# Patient Record
Sex: Male | Born: 1940 | Race: White | Hispanic: No | Marital: Married | Smoking: Never smoker
Health system: Southern US, Community
[De-identification: ages and names within clinical notes are randomized; demographics above are authoritative.]

## PROBLEM LIST (undated history)

## (undated) DIAGNOSIS — F32A Depression, unspecified: Secondary | ICD-10-CM

## (undated) DIAGNOSIS — I129 Hypertensive chronic kidney disease with stage 1 through stage 4 chronic kidney disease, or unspecified chronic kidney disease: Secondary | ICD-10-CM

## (undated) DIAGNOSIS — I4891 Unspecified atrial fibrillation: Secondary | ICD-10-CM

## (undated) DIAGNOSIS — N182 Chronic kidney disease, stage 2 (mild): Secondary | ICD-10-CM

## (undated) DIAGNOSIS — F419 Anxiety disorder, unspecified: Secondary | ICD-10-CM

## (undated) DIAGNOSIS — I2510 Atherosclerotic heart disease of native coronary artery without angina pectoris: Secondary | ICD-10-CM

## (undated) DIAGNOSIS — K219 Gastro-esophageal reflux disease without esophagitis: Secondary | ICD-10-CM

## (undated) DIAGNOSIS — E1122 Type 2 diabetes mellitus with diabetic chronic kidney disease: Secondary | ICD-10-CM

## (undated) DIAGNOSIS — I10 Essential (primary) hypertension: Secondary | ICD-10-CM

## (undated) DIAGNOSIS — F329 Major depressive disorder, single episode, unspecified: Secondary | ICD-10-CM

## (undated) DIAGNOSIS — M1990 Unspecified osteoarthritis, unspecified site: Secondary | ICD-10-CM

## (undated) DIAGNOSIS — I499 Cardiac arrhythmia, unspecified: Secondary | ICD-10-CM

## (undated) DIAGNOSIS — E785 Hyperlipidemia, unspecified: Secondary | ICD-10-CM

## (undated) HISTORY — PX: SHOULDER ARTHROSCOPY: SHX128

## (undated) HISTORY — DX: Chronic kidney disease, stage 2 (mild): N18.2

## (undated) HISTORY — DX: Hypertensive chronic kidney disease with stage 1 through stage 4 chronic kidney disease, or unspecified chronic kidney disease: I12.9

## (undated) HISTORY — PX: BACK SURGERY: SHX140

## (undated) HISTORY — PX: CERVICAL DISC SURGERY: SHX588

## (undated) HISTORY — PX: TONSILLECTOMY: SUR1361

## (undated) HISTORY — DX: Type 2 diabetes mellitus with diabetic chronic kidney disease: E11.22

## (undated) HISTORY — DX: Essential (primary) hypertension: I10

## (undated) HISTORY — DX: Hyperlipidemia, unspecified: E78.5

## (undated) HISTORY — DX: Unspecified atrial fibrillation: I48.91

## (undated) HISTORY — DX: Atherosclerotic heart disease of native coronary artery without angina pectoris: I25.10

## (undated) HISTORY — PX: INGUINAL HERNIA REPAIR: SUR1180

## (undated) HISTORY — DX: Gastro-esophageal reflux disease without esophagitis: K21.9

## (undated) HISTORY — PX: APPENDECTOMY: SHX54

---

## 1999-04-26 HISTORY — PX: CORONARY ARTERY BYPASS GRAFT: SHX141

## 2014-04-25 HISTORY — PX: COLONOSCOPY: SHX174

## 2015-01-24 HISTORY — PX: SIGMOIDOSCOPY: SUR1295

## 2015-01-24 HISTORY — PX: ESOPHAGOGASTRODUODENOSCOPY: SHX1529

## 2015-11-02 ENCOUNTER — Telehealth: Payer: Self-pay | Admitting: Gastroenterology

## 2015-11-02 NOTE — Telephone Encounter (Signed)
Pt called to see if SF had received a referral. I told patient that DS had mailed a triage letter out today, but she was already gone for the day. Please call patient at [phone]

## 2015-11-03 NOTE — Telephone Encounter (Signed)
Pt wanted an OV so I made him one for 11/26/15 @ 9:30

## 2015-11-03 NOTE — Telephone Encounter (Signed)
LMOM TO CALL BACK

## 2015-11-10 ENCOUNTER — Encounter: Payer: Self-pay | Admitting: Nurse Practitioner

## 2015-11-26 ENCOUNTER — Ambulatory Visit: Payer: Self-pay | Admitting: Nurse Practitioner

## 2015-12-14 ENCOUNTER — Ambulatory Visit: Payer: Self-pay | Admitting: Nurse Practitioner

## 2015-12-16 ENCOUNTER — Ambulatory Visit (INDEPENDENT_AMBULATORY_CARE_PROVIDER_SITE_OTHER): Payer: Medicare Other | Admitting: Gastroenterology

## 2015-12-16 ENCOUNTER — Encounter: Payer: Self-pay | Admitting: Gastroenterology

## 2015-12-16 DIAGNOSIS — K649 Unspecified hemorrhoids: Secondary | ICD-10-CM | POA: Diagnosis not present

## 2015-12-16 DIAGNOSIS — K5900 Constipation, unspecified: Secondary | ICD-10-CM | POA: Diagnosis not present

## 2015-12-16 MED ORDER — HYDROCORTISONE 2.5 % RE CREA: 1.0000 "application " | TOPICAL_CREAM | Freq: Two times a day (BID) | RECTAL | 1 refills | Status: DC

## 2015-12-16 MED ORDER — LUBIPROSTONE 24 MCG PO CAPS
24.0000 ug | ORAL_CAPSULE | Freq: Two times a day (BID) | ORAL | 5 refills | Status: DC
Start: 2015-12-16 — End: 2015-12-16

## 2015-12-16 MED ORDER — LUBIPROSTONE 24 MCG PO CAPS: 24.0000 ug | ORAL_CAPSULE | Freq: Two times a day (BID) | ORAL | 5 refills | Status: DC

## 2015-12-16 NOTE — Patient Instructions (Signed)
1. Start Amitiza twice daily with food for constipation. You can stop stool softener once you start having regular bowel movements on the Amitiza.  2. Start hydrocortisone cream twice daily for hemorrhoids, insert medication just inside the anal opening.  3. Return to the office in 4-6 weeks for follow up and possible hemorrhoid banding.

## 2015-12-16 NOTE — Progress Notes (Signed)
CC'ED TO PCP 

## 2015-12-16 NOTE — Progress Notes (Signed)
Primary Care Physician:  Isabella BowensVASIREDDY,VENUGOPAL KIRAN, MD  Primary Gastroenterologist:  Roetta SessionsMichael Rourk, MD   Chief Complaint  Patient presents with  . Constipation    HPI:  Johnathan KannerWilliam R Chavez is a 75 y.o. male who presents today as a new patient evaluation for constipation and hemorrhoids. Referral initially sent over by his PCP back in July 2017 for colonoscopy. Patient rescheduled couple visits before is able to come in today. He is actually up-to-date on colonoscopy. He brought me his records. He is interested in having his constipation and hemorrhoids managed. Records [place] [facility]. He had nonbleeding internal hemorrhoids, diverticulosis of the sigmoid colon, 4 mm polyp removed from the hepatic and the descending colon. Terminal ileum was normal. Pathology revealed tubular adenoma. He was advised to come back in 5 years.  Last year patient began having significant constipation issues. Associated with hemorrhoidal bleeding. Was seen at that day and will gastroenterology underwent EGD which showed gastritis, sigmoidoscopy showing internal hemorrhoids. Patient was dissatisfied with his care and decided to come here for further evaluation.  Patient is utilizing a stool softener 2 at bedtime. Takes Ex-Lax every third day. Hasn't had a bowel movement. Never has a easy BM. Has to strain quite a bit. Bright red blood per rectum noted 90% of the time. Does not use pain medication on a regular basis. Tramadol only as needed. Heartburn well-controlled on omeprazole twice daily. He notes his hemorrhoids do prolapse out but then go back in on their own. Patient has never used any hemorrhoid medication. Denies rectal pain. No abdominal pain.   Current Outpatient Prescriptions  Medication Sig Dispense Refill  . amLODipine (NORVASC) 10 MG tablet Take 10 mg by mouth daily.  0  . aspirin (GOODSENSE ASPIRIN) 325 MG  tablet Take by mouth.    . calcium-vitamin D (SM CALCIUM 500/VITAMIN D3) 500-400 MG-UNIT tablet Take by mouth.    . docusate sodium (COLACE) 100 MG capsule 2 (TWO) CAPSULE, ORAL, AT BEDTIME    . gabapentin (NEURONTIN) 300 MG capsule Take by mouth.    . Ginkgo Biloba 40 MG TABS Take by mouth.    . Glucosamine-Chondroitin 500-400 MG CAPS Take by mouth.    Marland Kitchen. lisinopril (PRINIVIL,ZESTRIL) 20 MG tablet Take by mouth.    . metFORMIN (GLUMETZA) 500 MG (MOD) 24 hr tablet Take by mouth.    . Omega-3 Fatty Acids (FISH OIL CONCENTRATE) 300 MG CAPS Take by mouth.    Marland Kitchen. omeprazole (PRILOSEC) 20 MG capsule Take 20 mg by mouth 2 (two) times daily before a meal.    . Rivaroxaban (XARELTO) 15 MG TABS tablet Take by mouth.    . simvastatin (ZOCOR) 40 MG tablet Take by mouth.    . traMADol (ULTRAM) 50 MG tablet Take 50 mg by mouth 2 (two) times daily as needed.  0  . vitamin C (ASCORBIC ACID) 500 MG tablet Take by mouth.                   No current facility-administered medications for this visit.     Allergies as of 12/16/2015 - Review Complete 12/16/2015  Allergen Reaction Noted  . Codeine Nausea Only 02/19/2015    Past Medical History:  Diagnosis Date  . A-fib (HCC)   . CAD (coronary artery disease)   . CKD (chronic kidney disease) stage 2, GFR 60-89 ml/min   . GERD (gastroesophageal reflux disease)   . HTN (hypertension)   .  Hyperlipidemia   . 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 APPENDECTOMY    . BACK SURGERY     X2  . CERVICAL DISC SURGERY    . COLONOSCOPY  04/2014   [facility]: moderate sized internal hemorrhoids, 2 tubular adenomas removed from colon. diverticulosis  . CORONARY ARTERY BYPASS GRAFT  2001  . ESOPHAGOGASTRODUODENOSCOPY  01/2015   Dr. Allena KatzPatel: gastritis  . INGUINAL HERNIA REPAIR Left   . SHOULDER ARTHROSCOPY Right   . SIGMOIDOSCOPY  01/2015   Dr. Allena KatzPatel: internal hemorrhoids  .  TONSILLECTOMY      Family History  Problem Relation Age of Onset  . Diabetes Mother   . Colon cancer Father     ?colon cancer, age early 6970    Social History   Social History  . Marital status: Married    Spouse name: N/A  . Number of children: N/A  . Years of education: N/A   Occupational History  . Not on file.   Social History Main Topics  . Smoking status: Never Smoker  . Smokeless tobacco: Never Used  . Alcohol use No  . Drug use: No  . Sexual activity: Not on file   Other Topics Concern  . Not on file   Social History Narrative  . No narrative on file      ROS:  General: Negative for anorexia, weight loss, fever, chills, fatigue, weakness. Eyes: Negative for vision changes.  ENT: Negative for hoarseness, difficulty swallowing , nasal congestion. CV: Negative for chest pain, angina, palpitations, dyspnea on exertion, peripheral edema.  Respiratory: Negative for dyspnea at rest, dyspnea on exertion, cough, sputum, wheezing.  GI: See history of present illness. GU:  Negative for dysuria, hematuria, urinary incontinence, urinary frequency, nocturnal urination.  MS: Negative for joint pain, low back pain.  Derm: Negative for rash or itching.  Neuro: Negative for weakness, abnormal sensation, seizure, frequent headaches, memory loss, confusion.  Psych: Negative for anxiety, depression, suicidal ideation, hallucinations.  Endo: Negative for unusual weight change.  Heme: Negative for bruising or bleeding. Allergy: Negative for rash or hives.    Physical Examination:  BP 137/77   Pulse 73   Temp 98.1 F (36.7 C) (Oral)   Ht 5\' 5"  (1.651 m)   Wt 176 lb 6.4 oz (80 kg)   BMI 29.35 kg/m    General: Well-nourished, well-developed in no acute distress.  Head: Normocephalic, atraumatic.   Eyes: Conjunctiva pink, no icterus. Mouth: Oropharyngeal mucosa moist and pink , no lesions erythema or exudate. Neck: Supple without thyromegaly, masses, or  lymphadenopathy.  Lungs: Clear to auscultation bilaterally.  Heart: Regular rate and rhythm, no murmurs rubs or gallops.  Abdomen: Bowel sounds are normal, nontender, nondistended, no hepatosplenomegaly or masses, no abdominal bruits or    hernia , no rebound or guarding.   Rectal: deferred Extremities: No lower extremity edema. No clubbing or deformities.  Neuro: Alert and oriented x 4 , grossly normal neurologically.  Skin: Warm and dry, no rash or jaundice.   Psych: Alert and cooperative, normal mood and affect.  Labs: Labs from April 2017 Hemoglobin A1c 7.8 BUN 25, creatinine 1.3, white blood cell count 8900, hemoglobin 14.7, hematocrit 46.4, MCV 93.2, platelets 237,000  Imaging Studies: No results found.

## 2015-12-16 NOTE — Assessment & Plan Note (Signed)
75 year old gentleman who presents with nearly one year history of change in bowel habits with constipation. Associated straining, rectal bleeding 90% of the time. Taking Colace 200 mg daily, Ex-Lax 2 times weekly. Last complete colonoscopy January 2016 as outlined above. Sigmoidoscopy October 2016 with internal hemorrhoids is noted. Patient describes reducible prolapsed hemorrhoids at times.  Discussed constipation management with patient. Need to get that under control and avoid straining, prolonged toilet time. Begin Amitiza 24 g twice a day. Come off Colace once regular bowel movement. Hydrocortisone cream anal rectally twice a day for 2 weeks. Patient will call if current bowel regimen not effective or too effective. Otherwise we will see him back in 4-6 weeks on [facility] as he sounds like a good candidate based on prior colonoscopy/sigmoidoscopy reports. Discussed [facility] banding with patient and he would like to pursue if he is a candidate.

## 2015-12-18 ENCOUNTER — Telehealth: Payer: Self-pay

## 2015-12-18 NOTE — Telephone Encounter (Signed)
Pt left Vm that he has questions about the Amitiza. ' I called and LMOM for a return call.

## 2015-12-24 NOTE — Telephone Encounter (Signed)
Pt's wife called and said pt is unable to take the Amitiza. He tried bid , then qd and then tried 1/2 tablet and the medication makes him very weak and gives him nausea. He was taking it with food. If he does not take it, he cannot have a BM. He still has a lot of bleeding when he has BM, but is using the hemorrhoid cream.  Please advise and call Diane back at [phone].   Routing to Tana CoastLeslie Lewis, PA to advise and to JL, this is RMR pt.

## 2015-12-24 NOTE — Telephone Encounter (Signed)
Stop Amitiza. Try Linzess daily on empty stomach. Can provide samples or send in RX for #30 with 1 refill. Continue cream. He has hemorrhoids. We are trying to get BMs under control prior to hemorrhoids banding which is scheduled.

## 2015-12-24 NOTE — Telephone Encounter (Signed)
Pt's wife is aware. I called the prescription for the Linzess to the pharmacy and left the message on VM.  Pt's wife also wanted me to leave a few samples at front for pt to pick up if they were as expensive as the Amitiza.  I am leaving #12 samples at front for pick up. Pt's wife is aware to STOP Amitiza. I also left Vm for the pharmacy to D/C Amitiza.

## 2016-01-04 ENCOUNTER — Ambulatory Visit: Payer: Self-pay | Admitting: Nurse Practitioner

## 2016-01-19 ENCOUNTER — Ambulatory Visit: Payer: Medicare Other | Admitting: Internal Medicine

## 2016-01-19 ENCOUNTER — Encounter: Payer: Self-pay | Admitting: Internal Medicine

## 2016-01-19 VITALS — BP 121/60 | HR 58 | Temp 97.4°F | Ht 65.0 in | Wt 179.6 lb

## 2016-01-19 DIAGNOSIS — K921 Melena: Secondary | ICD-10-CM

## 2016-01-19 DIAGNOSIS — K5909 Other constipation: Secondary | ICD-10-CM

## 2016-01-19 DIAGNOSIS — K219 Gastro-esophageal reflux disease without esophagitis: Secondary | ICD-10-CM

## 2016-01-19 NOTE — Patient Instructions (Signed)
Continue linzess 72 daily as needed - our goal is a BM every other day or at least 3x weekly  Continue omeprazole daily for reflux  Office visit with us in 1 year and as needed

## 2016-01-19 NOTE — Progress Notes (Signed)
Primary Care Physician:  Isabella Bowens, MD Primary Gastroenterologist:  Dr. Jena Gauss  Pre-Procedure History & Physical: HPI:  Johnathan Chavez is a 75 y.o. male here for follow-up of rectal bleeding and constipation. Seen here last month. Originally prescribed Amitiza. Linzess turned out to be a better choice from a reimbursement standpoint. His states taking Linzess 72 every other day when necessary. Has a bowel movement at least 3 times weekly - he is very pleased. Rectal bleeding has ceased  - colonoscopy report reviewed.  He is happy. GERD symptoms well controlled on omeprazole.  There were some discussion about hemorrhoid banding, however, he is anticoagulated for atrial fibrillation.  Past Medical History:  Diagnosis Date  . A-fib (HCC)   . CAD (coronary artery disease)   . CKD (chronic kidney disease) stage 2, GFR 60-89 ml/min   . GERD (gastroesophageal reflux disease)   . HTN (hypertension)   . Hyperlipidemia   . 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 APPENDECTOMY    . BACK SURGERY     X2  . CERVICAL DISC SURGERY    . COLONOSCOPY  04/2014   [facility]: moderate sized internal hemorrhoids, 2 tubular adenomas removed from colon. diverticulosis  . CORONARY ARTERY BYPASS GRAFT  2001  . ESOPHAGOGASTRODUODENOSCOPY  01/2015   Dr. Allena Katz: gastritis  . INGUINAL HERNIA REPAIR Left   . SHOULDER ARTHROSCOPY Right   . SIGMOIDOSCOPY  01/2015   Dr. Allena Katz: internal hemorrhoids  . TONSILLECTOMY      Prior to Admission medications   Medication Sig Start Date End Date Taking? Authorizing Provider  amLODipine (NORVASC) 10 MG tablet Take 10 mg by mouth daily. 12/08/15  Yes Historical Provider, MD  aspirin (GOODSENSE ASPIRIN) 325 MG tablet Take by mouth.   Yes Historical Provider, MD  calcium-vitamin D (SM CALCIUM 500/VITAMIN D3) 500-400 MG-UNIT tablet Take by mouth.   Yes Historical  Provider, MD  docusate sodium (COLACE) 100 MG capsule 2 (TWO) CAPSULE, ORAL, AT BEDTIME 01/19/15  Yes Historical Provider, MD  gabapentin (NEURONTIN) 300 MG capsule Take by mouth.   Yes Historical Provider, MD  Ginkgo Biloba 40 MG TABS Take by mouth.   Yes Historical Provider, MD  Glucosamine-Chondroitin 500-400 MG CAPS Take by mouth.   Yes Historical Provider, MD  hydrocortisone (ANUSOL-HC) 2.5 % rectal cream Place 1 application rectally 2 (two) times daily. 12/16/15  Yes Tiffany Kocher, PA-C  lisinopril (PRINIVIL,ZESTRIL) 20 MG tablet Take by mouth.   Yes Historical Provider, MD  metFORMIN (GLUMETZA) 500 MG (MOD) 24 hr tablet Take by mouth.   Yes Historical Provider, MD  Omega-3 Fatty Acids (FISH OIL CONCENTRATE) 300 MG CAPS Take by mouth.   Yes Historical Provider, MD  omeprazole (PRILOSEC) 20 MG capsule Take 20 mg by mouth 2 (two) times daily before a meal.   Yes Historical Provider, MD  simvastatin (ZOCOR) 40 MG tablet Take by mouth.   Yes Historical Provider, MD  traMADol (ULTRAM) 50 MG tablet Take 50 mg by mouth 2 (two) times daily as needed. 10/13/15  Yes Historical Provider, MD  vitamin C (ASCORBIC ACID) 500 MG tablet Take by mouth.   Yes Historical Provider, MD  warfarin (COUMADIN) 5 MG tablet Take 5 mg by mouth daily. 5 mg Tues and Wednesday and 2.5 mg the other days   Yes Historical Provider, MD  Rivaroxaban (XARELTO) 15 MG TABS tablet Take by mouth.  09/14/15   Historical Provider, MD    Allergies as of 01/19/2016 - Review Complete 01/19/2016  Allergen Reaction Noted  . Codeine Nausea Only 02/19/2015    Family History  Problem Relation Age of Onset  . Diabetes Mother   . Colon cancer Father     ?colon cancer, age early 9070    Social History   Social History  . Marital status: Married    Spouse name: N/A  . Number of children: N/A  . Years of education: N/A   Occupational History  . Not on file.   Social History Main Topics  . Smoking status: Never Smoker  . Smokeless  tobacco: Never Used  . Alcohol use No  . Drug use: No  . Sexual activity: Not on file   Other Topics Concern  . Not on file   Social History Narrative  . No narrative on file    Review of Systems: See HPI, otherwise negative ROS  Physical Exam: BP 121/60   Pulse (!) 58   Temp 97.4 F (36.3 C) (Oral)   Ht 5\' 5"  (1.651 m)   Wt 179 lb 9.6 oz (81.5 kg)   BMI 29.89 kg/m  General:   Alert,  Well-developed, well-nourished, pleasant and cooperative in NAD Neck:  Supple; no masses or thyromegaly. No significant cervical adenopathy. Lungs:  Clear throughout to auscultation.   No wheezes, crackles, or rhonchi. No acute distress. Heart:  Regular rate and rhythm; no murmurs, clicks, rubs,  or gallops. Abdomen: Non-distended, normal bowel sounds.  Soft and nontender without appreciable mass or hepatosplenomegaly.  Pulses:  Normal pulses noted. Extremities:  Without clubbing or edema.  Impression:  Pleasant 75 year old gentleman with intermittent paper hematochezia in the setting of anticoagulation constipation. Now doing very well takingLinzess 72 when necessary. He is having at least 3 bowel movements weekly which is within range.  Recommendations:   Continue linzess 72 daily as needed - our goal is a BM every other day or at least 3x weekly  Continue omeprazole daily for reflux  Office visit with us in 1 year and as needed         Notice: This dictation was prepared with Dragon dictation along with smaller phrase technology. Any transcriptional errors that result from this process are unintentional and may not be corrected upon review.

## 2016-03-04 ENCOUNTER — Encounter (HOSPITAL_COMMUNITY): Payer: Self-pay | Admitting: Emergency Medicine

## 2016-03-04 ENCOUNTER — Inpatient Hospital Stay (HOSPITAL_COMMUNITY)
Admission: EM | Admit: 2016-03-04 | Discharge: 2016-03-07 | DRG: 439 | Disposition: A | Discharge status: Home / Self Care | Payer: Medicare Other | Attending: Family Medicine | Admitting: Family Medicine

## 2016-03-04 ENCOUNTER — Emergency Department (HOSPITAL_COMMUNITY): Payer: Medicare Other

## 2016-03-04 DIAGNOSIS — N183 Chronic kidney disease, stage 3 unspecified: Secondary | ICD-10-CM | POA: Diagnosis present

## 2016-03-04 DIAGNOSIS — J9811 Atelectasis: Secondary | ICD-10-CM | POA: Diagnosis present

## 2016-03-04 DIAGNOSIS — E1122 Type 2 diabetes mellitus with diabetic chronic kidney disease: Secondary | ICD-10-CM | POA: Diagnosis present

## 2016-03-04 DIAGNOSIS — E119 Type 2 diabetes mellitus without complications: Secondary | ICD-10-CM

## 2016-03-04 DIAGNOSIS — J90 Pleural effusion, not elsewhere classified: Secondary | ICD-10-CM | POA: Diagnosis present

## 2016-03-04 DIAGNOSIS — K859 Acute pancreatitis without necrosis or infection, unspecified: Secondary | ICD-10-CM

## 2016-03-04 DIAGNOSIS — N179 Acute kidney failure, unspecified: Secondary | ICD-10-CM | POA: Diagnosis present

## 2016-03-04 DIAGNOSIS — Z833 Family history of diabetes mellitus: Secondary | ICD-10-CM

## 2016-03-04 DIAGNOSIS — I129 Hypertensive chronic kidney disease with stage 1 through stage 4 chronic kidney disease, or unspecified chronic kidney disease: Secondary | ICD-10-CM | POA: Diagnosis present

## 2016-03-04 DIAGNOSIS — K851 Biliary acute pancreatitis without necrosis or infection: Secondary | ICD-10-CM | POA: Diagnosis present

## 2016-03-04 DIAGNOSIS — D649 Anemia, unspecified: Secondary | ICD-10-CM | POA: Diagnosis present

## 2016-03-04 DIAGNOSIS — K8500 Idiopathic acute pancreatitis without necrosis or infection: Principal | ICD-10-CM

## 2016-03-04 DIAGNOSIS — K2980 Duodenitis without bleeding: Secondary | ICD-10-CM | POA: Diagnosis present

## 2016-03-04 DIAGNOSIS — R748 Abnormal levels of other serum enzymes: Secondary | ICD-10-CM

## 2016-03-04 DIAGNOSIS — R7989 Other specified abnormal findings of blood chemistry: Secondary | ICD-10-CM | POA: Diagnosis present

## 2016-03-04 DIAGNOSIS — K861 Other chronic pancreatitis: Secondary | ICD-10-CM | POA: Diagnosis present

## 2016-03-04 DIAGNOSIS — I4891 Unspecified atrial fibrillation: Secondary | ICD-10-CM | POA: Diagnosis present

## 2016-03-04 DIAGNOSIS — N289 Disorder of kidney and ureter, unspecified: Secondary | ICD-10-CM

## 2016-03-04 DIAGNOSIS — K8020 Calculus of gallbladder without cholecystitis without obstruction: Secondary | ICD-10-CM | POA: Diagnosis present

## 2016-03-04 DIAGNOSIS — Z7901 Long term (current) use of anticoagulants: Secondary | ICD-10-CM

## 2016-03-04 DIAGNOSIS — K219 Gastro-esophageal reflux disease without esophagitis: Secondary | ICD-10-CM | POA: Diagnosis present

## 2016-03-04 DIAGNOSIS — I2510 Atherosclerotic heart disease of native coronary artery without angina pectoris: Secondary | ICD-10-CM | POA: Diagnosis present

## 2016-03-04 DIAGNOSIS — Z800 Family history of malignant neoplasm of digestive organs: Secondary | ICD-10-CM

## 2016-03-04 DIAGNOSIS — R1011 Right upper quadrant pain: Secondary | ICD-10-CM | POA: Diagnosis not present

## 2016-03-04 DIAGNOSIS — K828 Other specified diseases of gallbladder: Secondary | ICD-10-CM | POA: Diagnosis present

## 2016-03-04 DIAGNOSIS — E785 Hyperlipidemia, unspecified: Secondary | ICD-10-CM | POA: Diagnosis present

## 2016-03-04 DIAGNOSIS — Z7984 Long term (current) use of oral hypoglycemic drugs: Secondary | ICD-10-CM

## 2016-03-04 DIAGNOSIS — R945 Abnormal results of liver function studies: Secondary | ICD-10-CM

## 2016-03-04 LAB — DIFFERENTIAL
BASOS ABS: 0 10*3/uL (ref 0.0–0.1)
BASOS PCT: 0 %
EOS ABS: 0.4 10*3/uL (ref 0.0–0.7)
Eosinophils Relative: 3 %
Lymphocytes Relative: 13 %
Lymphs Abs: 1.6 10*3/uL (ref 0.7–4.0)
MONOS PCT: 9 %
Monocytes Absolute: 1.1 10*3/uL — ABNORMAL HIGH (ref 0.1–1.0)
NEUTROS PCT: 75 %
Neutro Abs: 9.5 10*3/uL — ABNORMAL HIGH (ref 1.7–7.7)

## 2016-03-04 LAB — COMPREHENSIVE METABOLIC PANEL
ALBUMIN: 3.5 g/dL (ref 3.5–5.0)
ALK PHOS: 181 U/L — AB (ref 38–126)
ALT: 91 U/L — ABNORMAL HIGH (ref 17–63)
AST: 149 U/L — AB (ref 15–41)
Anion gap: 8 (ref 5–15)
BILIRUBIN TOTAL: 1.6 mg/dL — AB (ref 0.3–1.2)
BUN: 32 mg/dL — AB (ref 6–20)
CALCIUM: 8.1 mg/dL — AB (ref 8.9–10.3)
CO2: 22 mmol/L (ref 22–32)
CREATININE: 1.9 mg/dL — AB (ref 0.61–1.24)
Chloride: 105 mmol/L (ref 101–111)
GFR calc Af Amer: 38 mL/min — ABNORMAL LOW (ref >60)
GFR, EST NON AFRICAN AMERICAN: 33 mL/min — AB (ref >60)
GLUCOSE: 129 mg/dL — AB (ref 65–99)
Potassium: 4 mmol/L (ref 3.5–5.1)
Sodium: 135 mmol/L (ref 135–145)
TOTAL PROTEIN: 6.8 g/dL (ref 6.5–8.1)

## 2016-03-04 LAB — CBC
HEMATOCRIT: 32.2 % — AB (ref 39.0–52.0)
Hemoglobin: 10.9 g/dL — ABNORMAL LOW (ref 13.0–17.0)
MCH: 30.3 pg (ref 26.0–34.0)
MCHC: 33.9 g/dL (ref 30.0–36.0)
MCV: 89.4 fL (ref 78.0–100.0)
PLATELETS: 240 10*3/uL (ref 150–400)
RBC: 3.6 MIL/uL — ABNORMAL LOW (ref 4.22–5.81)
RDW: 13.8 % (ref 11.5–15.5)
WBC: 12.1 10*3/uL — AB (ref 4.0–10.5)

## 2016-03-04 LAB — LIPASE, BLOOD: Lipase: 28 U/L (ref 11–51)

## 2016-03-04 MED ORDER — SODIUM CHLORIDE 0.9 % IV BOLUS (SEPSIS)
1000.0000 mL | Freq: Once | INTRAVENOUS | Status: AC
  Administered 2016-03-05: 1000 mL via INTRAVENOUS

## 2016-03-04 NOTE — ED Provider Notes (Signed)
[facility]-EMERGENCY DEPT Provider Note   CSN: [id number] Arrival date & time: 03/04/16  2204  By signing my name below, I, Johnathan Chavez, attest that this documentation has been prepared under the direction and in the presence of Dione Booze, MD. Electronically Signed: Morene Chavez, Scribe. 03/04/16. 11:51 PM.  History   Chief Complaint Chief Complaint  Patient presents with  . Abnormal Lab    The history is provided by the patient. No language interpreter was used.   HPI Comments: Johnathan Chavez is a 75 y.o. male with a PMHx of A-Fib, CAD, CKD, DM, and HTN who presents to the Emergency Department for abnormal lab this evening. Pt was initially evaluated at an Urgent Care in [facility] for repeat labs. At that time, pt was advised that his liver enzymes have worsened from time of admission at [facility] for pancreatitis. Pt was encouraged to come to the Emergency Department for further evaluation. He now complains of ongoing right flank pain with a severity of 5/10 that worsens when standing. While at Urgent Care pt was given a shot of pain medication 7 hours ago. At this time, he denies any nasuea, constipation, diarrhea,  fevers, chill, diaphoreses, or any other pain. Denies any loss of appetite.    Past Medical History:  Diagnosis Date  . A-fib (HCC)   . CAD (coronary artery disease)   . CKD (chronic kidney disease) stage 2, GFR 60-89 ml/min   . GERD (gastroesophageal reflux disease)   . HTN (hypertension)   . Hyperlipidemia   . 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 Constipation 12/16/2015  . Bleeding hemorrhoid 12/16/2015    Past Surgical History:  Procedure Laterality Date  . APPENDECTOMY    . BACK SURGERY     X2  . CERVICAL DISC SURGERY    . COLONOSCOPY  04/2014   [facility]: moderate sized internal hemorrhoids, 2 tubular adenomas removed from colon. diverticulosis  . CORONARY  ARTERY BYPASS GRAFT  2001  . ESOPHAGOGASTRODUODENOSCOPY  01/2015   Dr. Allena Katz: gastritis  . INGUINAL HERNIA REPAIR Left   . SHOULDER ARTHROSCOPY Right   . SIGMOIDOSCOPY  01/2015   Dr. Allena Katz: internal hemorrhoids  . TONSILLECTOMY         Home Medications    Prior to Admission medications   Medication Sig Start Date End Date Taking? Authorizing Provider  amLODipine (NORVASC) 10 MG tablet Take 10 mg by mouth daily. 12/08/15  Yes Historical Provider, MD  aspirin (GOODSENSE ASPIRIN) 325 MG tablet Take 325 mg by mouth daily.    Yes Historical Provider, MD  calcium-vitamin D (SM CALCIUM 500/VITAMIN D3) 500-400 MG-UNIT tablet Take 1 tablet by mouth daily.    Yes Historical Provider, MD  Cyanocobalamin (VITAMIN B 12 PO) Take 1,000 mg by mouth daily.   Yes Historical Provider, MD  escitalopram (LEXAPRO) 10 MG tablet Take 10 mg by mouth daily.   Yes Historical Provider, MD  gabapentin (NEURONTIN) 300 MG capsule Take 300 mg by mouth 2 (two) times daily.    Yes Historical Provider, MD  Ginkgo Biloba 60 MG TABS Take 60 mg by mouth daily.   Yes Historical Provider, MD  glipiZIDE (GLUCOTROL) 10 MG tablet Take 10 mg by mouth 3 (three) times daily.   Yes Historical Provider, MD  Glucosamine-Chondroit-Vit C-Mn (GLUCOSAMINE 1500 COMPLEX PO) Take 1,500 mg by mouth daily.   Yes Historical Provider,  MD  linaclotide (LINZESS) 72 MCG capsule Take 72 mcg by mouth daily as needed (hard stool).    Yes Historical Provider, MD  lisinopril (PRINIVIL,ZESTRIL) 20 MG tablet Take 20 mg by mouth daily.    Yes Historical Provider, MD  metFORMIN (GLUCOPHAGE) 1000 MG tablet Take 1,000 mg by mouth 2 (two) times daily.   Yes Historical Provider, MD  Multiple Vitamin (MULTIVITAMIN WITH MINERALS) TABS tablet Take 1 tablet by mouth daily.   Yes Historical Provider, MD  Omega-3 Fatty Acids (FISH OIL) 1200 MG CAPS Take 1,200 mg by mouth daily.   Yes Historical Provider, MD  omeprazole (PRILOSEC) 20 MG capsule Take 20 mg by mouth  daily.    Yes Historical Provider, MD  pantoprazole (PROTONIX) 40 MG tablet Take 40 mg by mouth 2 (two) times daily.   Yes Historical Provider, MD  simvastatin (ZOCOR) 40 MG tablet Take 40 mg by mouth daily.    Yes Historical Provider, MD  traMADol (ULTRAM) 50 MG tablet Take 50 mg by mouth 2 (two) times daily as needed for moderate pain.  10/13/15  Yes Historical Provider, MD  vitamin C (ASCORBIC ACID) 500 MG tablet Take 500 mg by mouth daily.    Yes Historical Provider, MD  warfarin (COUMADIN) 5 MG tablet Take 5 mg by mouth daily.    Yes Historical Provider, MD  FLUZONE HIGH-DOSE 0.5 ML SUSY TO BE ADMINISTERED BY PHARMACIST FOR IMMUNIZATION 02/19/16   Historical Provider, MD  hydrocortisone (ANUSOL-HC) 2.5 % rectal cream Place 1 application rectally 2 (two) times daily. Patient not taking: Reported on 03/04/2016 12/16/15   Tiffany KocherLeslie S Lewis, PA-C  PREVNAR 13 SUSP injection TO BE ADMINISTERED BY PHARMACIST FOR IMMUNIZATION 02/19/16   Historical Provider, MD    Family History Family History  Problem Relation Age of Onset  . Diabetes Mother   . Colon cancer Father     ?colon cancer, age early 7170    Social History Social History  Substance Use Topics  . Smoking status: Never Smoker  . Smokeless tobacco: Never Used  . Alcohol use No     Allergies   Codeine   Review of Systems Review of Systems  Constitutional: Negative for chills, diaphoresis and fever.  Respiratory: Negative for shortness of breath.   Cardiovascular: Negative for chest pain.  Gastrointestinal: Negative for constipation, diarrhea and nausea.  Genitourinary: Positive for flank pain (right).  All other systems reviewed and are negative.    Physical Exam Updated Vital Signs BP 103/91 (BP Location: Left Arm)   Pulse 71   Temp 97.8 F (36.6 C) (Oral)   Resp 17   Ht 5\' 5"  (1.651 m)   Wt 180 lb (81.6 kg)   SpO2 97%   BMI 29.95 kg/m   Physical Exam  Constitutional: He is oriented to person, place, and time.  He appears well-developed and well-nourished.  HENT:  Head: Normocephalic and atraumatic.  Eyes: EOM are normal. Pupils are equal, round, and reactive to light.  Neck: Normal range of motion. Neck supple. No JVD present.  Cardiovascular: Normal rate, regular rhythm and normal heart sounds.   No murmur heard. Pulmonary/Chest: Effort normal and breath sounds normal. He has no wheezes. He has no rales. He exhibits no tenderness.  Abdominal: He exhibits no distension and no mass. There is tenderness.  Moderate suprapubic tenderness. Mild RUQ tenderness. Negative murphy sign.  No CVA tenderness.  Bowel sound decrease  Musculoskeletal: Normal range of motion. He exhibits no edema.  Lymphadenopathy:  He has no cervical adenopathy.  Neurological: He is alert and oriented to person, place, and time. No cranial nerve deficit. He exhibits normal muscle tone. Coordination normal.  Skin: Skin is warm and dry. No rash noted.  Psychiatric: He has a normal mood and affect. His behavior is normal. Judgment and thought content normal.  Nursing note and vitals reviewed.    ED Treatments / Results  DIAGNOSTIC STUDIES: Oxygen Saturation is 97% on RA, normal by my interpretation.    COORDINATION OF CARE: 11:32 PM- Will order blood work and imaging. Will give fluids. Discussed treatment plan with pt at bedside and pt agreed to plan.  Labs (all labs ordered are listed, but only abnormal results are displayed) Labs Reviewed  COMPREHENSIVE METABOLIC PANEL - Abnormal; Notable for the following:       Result Value   Glucose, Bld 129 (*)    BUN 32 (*)    Creatinine, Ser 1.90 (*)    Calcium 8.1 (*)    AST 149 (*)    ALT 91 (*)    Alkaline Phosphatase 181 (*)    Total Bilirubin 1.6 (*)    GFR calc non Af Amer 33 (*)    GFR calc Af Amer 38 (*)    All other components within normal limits  CBC - Abnormal; Notable for the following:    WBC 12.1 (*)    RBC 3.60 (*)    Hemoglobin 10.9 (*)    HCT  32.2 (*)    All other components within normal limits  URINALYSIS, ROUTINE W REFLEX MICROSCOPIC (NOT AT [facility]) - Abnormal; Notable for the following:    Color, Urine ORANGE (*)    APPearance CLOUDY (*)    Bilirubin Urine MODERATE (*)    Protein, ur 30 (*)    Nitrite POSITIVE (*)    Leukocytes, UA SMALL (*)    All other components within normal limits  DIFFERENTIAL - Abnormal; Notable for the following:    Neutro Abs 9.5 (*)    Monocytes Absolute 1.1 (*)    All other components within normal limits  URINE MICROSCOPIC-ADD ON - Abnormal; Notable for the following:    Squamous Epithelial / LPF 0-5 (*)    Bacteria, UA RARE (*)    Casts HYALINE CASTS (*)    All other components within normal limits  LIPASE, BLOOD    Radiology Ct Abdomen Pelvis Wo Contrast  Result Date: 03/05/2016 CLINICAL DATA:  Right abdominal and right flank pain. Elevated liver function studies. Recent episode of pancreatitis. Patient [place] urgent care in [place] and [facility] on 03/03/[facility]. EXAM: CT ABDOMEN AND PELVIS WITHOUT CONTRAST TECHNIQUE: Multidetector CT imaging of the abdomen and pelvis was performed following the standard protocol without IV contrast. COMPARISON:  None. FINDINGS: Lower chest: Small bilateral pleural effusions with basilar atelectasis, greater on the left. Postoperative changes in the mediastinum. Small esophageal hiatal hernia. Hepatobiliary: No focal liver abnormality is seen. No gallstones, gallbladder wall thickening, or biliary dilatation. Pancreas: Mild infiltration in the fat around the pancreas consistent with mild acute pancreatitis. No pancreatic ductal dilatation. No loculated fluid collections. Spleen: Normal in size without focal abnormality. Adrenals/Urinary Tract: No adrenal gland nodules. Prominent vascular calcifications demonstrated in the renal sinus arteries bilaterally. No hydronephrosis or hydroureter. No renal, ureteral, or bladder stones.  Bladder is decompressed without obvious bladder wall thickening. Prominent para renal fat bilaterally. Stomach/Bowel: Stomach is unremarkable. Small bowel are decompressed but there appears to be wall  thickening in the duodenum suggesting duodenitis. Scattered stool throughout the colon without distention or apparent wall thickening. Appendix is not identified. Vascular/Lymphatic: Aortic atherosclerosis. No enlarged abdominal or pelvic lymph nodes. Extensive vascular calcifications throughout the abdomen and pelvis. Reproductive: Prostate gland is enlarged, measuring 4.6 cm diameter. Other: No abdominal wall hernia or abnormality. No abdominopelvic ascites. Musculoskeletal: Postoperative and degenerative changes in the lumbar spine. No destructive bone lesions. IMPRESSION: Small bilateral pleural effusions with basilar atelectasis, greater on the left. Infiltration in the fat around the pancreas suggesting acute pancreatitis. Duodenal wall thickening suggesting duodenitis. No evidence of bowel obstruction. No renal or ureteral stone or obstruction. Diffuse vascular calcifications. Electronically Signed   By: Burman Nieves M.D.   On: 03/05/2016 00:15    Procedures Procedures (including critical care time)  Medications Ordered in ED Medications  sodium chloride 0.9 % bolus 1,000 mL (1,000 mLs Intravenous New Bag/Given 03/05/16 0026)     Initial Impression / Assessment and Plan / ED Course  I have reviewed the triage vital signs and the nursing notes.  Pertinent labs & imaging results that were available during my care of the patient were reviewed by me and considered in my medical decision making (see chart for details).  Clinical Course    Patient with ongoing abdominal discomfort following hospitalization for pancreatitis at [facility]. Family has brought records with him and he had a very high lipase which had normalized. He went to urgent care today because of ongoing discomfort and they noted  elevated liver function studies. Apparently, he had a CT scan done at [facility] with recommendation for follow-up scan which had not been obtained. Exam is fairly benign, patient is still anorectic and still having ongoing pain. On exam here, he does not have significant abdominal tenderness. Laboratory workup shows elevated liver enzymes with normal lipase. Renal insufficiency is present and had been present previously. CT shows evidence of pancreatitis as well as a pleural effusions are probably secondary to pancreatitis. Because of worsening liver functions, it was decided that he should be admitted. Case is discussed with Dr. Antionette Char [facility] who agrees to admit the patient.  Final Clinical Impressions(s) / ED Diagnoses   Final diagnoses:  Idiopathic acute pancreatitis without infection or necrosis  Elevated liver enzymes  Renal insufficiency  Normochromic normocytic anemia    New Prescriptions New Prescriptions   No medications on file   I personally performed the services described in this documentation, which was scribed in my presence. The recorded information has been reviewed and is accurate.       Dione Booze, MD 03/05/16 [phone]

## 2016-03-04 NOTE — ED Notes (Signed)
Patient came in complaining of right abdominal pain. Patient is not having pain after a pain pill.

## 2016-03-04 NOTE — ED Notes (Signed)
Patient went to CT

## 2016-03-04 NOTE — ED Triage Notes (Signed)
Pt reports being seen [place]gent [place] today for repeat labs and was told that his pancreatitis level was increased after [facility].

## 2016-03-04 NOTE — ED Notes (Signed)
Patient requested to urinate. Patient given an urinal. 

## 2016-03-05 ENCOUNTER — Inpatient Hospital Stay (HOSPITAL_COMMUNITY): Payer: Medicare Other

## 2016-03-05 ENCOUNTER — Encounter (HOSPITAL_COMMUNITY): Payer: Self-pay | Admitting: Family Medicine

## 2016-03-05 DIAGNOSIS — I4891 Unspecified atrial fibrillation: Secondary | ICD-10-CM | POA: Diagnosis present

## 2016-03-05 DIAGNOSIS — N289 Disorder of kidney and ureter, unspecified: Secondary | ICD-10-CM

## 2016-03-05 DIAGNOSIS — E785 Hyperlipidemia, unspecified: Secondary | ICD-10-CM | POA: Diagnosis present

## 2016-03-05 DIAGNOSIS — K2980 Duodenitis without bleeding: Secondary | ICD-10-CM | POA: Diagnosis present

## 2016-03-05 DIAGNOSIS — K861 Other chronic pancreatitis: Secondary | ICD-10-CM | POA: Diagnosis present

## 2016-03-05 DIAGNOSIS — N183 Chronic kidney disease, stage 3 unspecified: Secondary | ICD-10-CM | POA: Diagnosis present

## 2016-03-05 DIAGNOSIS — K219 Gastro-esophageal reflux disease without esophagitis: Secondary | ICD-10-CM | POA: Diagnosis present

## 2016-03-05 DIAGNOSIS — J90 Pleural effusion, not elsewhere classified: Secondary | ICD-10-CM | POA: Diagnosis present

## 2016-03-05 DIAGNOSIS — K8500 Idiopathic acute pancreatitis without necrosis or infection: Principal | ICD-10-CM

## 2016-03-05 DIAGNOSIS — R1011 Right upper quadrant pain: Secondary | ICD-10-CM | POA: Diagnosis present

## 2016-03-05 DIAGNOSIS — I2510 Atherosclerotic heart disease of native coronary artery without angina pectoris: Secondary | ICD-10-CM | POA: Diagnosis present

## 2016-03-05 DIAGNOSIS — Z833 Family history of diabetes mellitus: Secondary | ICD-10-CM | POA: Diagnosis not present

## 2016-03-05 DIAGNOSIS — E1122 Type 2 diabetes mellitus with diabetic chronic kidney disease: Secondary | ICD-10-CM | POA: Diagnosis present

## 2016-03-05 DIAGNOSIS — R945 Abnormal results of liver function studies: Secondary | ICD-10-CM

## 2016-03-05 DIAGNOSIS — J9811 Atelectasis: Secondary | ICD-10-CM | POA: Diagnosis present

## 2016-03-05 DIAGNOSIS — Z7901 Long term (current) use of anticoagulants: Secondary | ICD-10-CM | POA: Diagnosis not present

## 2016-03-05 DIAGNOSIS — K851 Biliary acute pancreatitis without necrosis or infection: Secondary | ICD-10-CM | POA: Diagnosis present

## 2016-03-05 DIAGNOSIS — Z7984 Long term (current) use of oral hypoglycemic drugs: Secondary | ICD-10-CM | POA: Diagnosis not present

## 2016-03-05 DIAGNOSIS — N179 Acute kidney failure, unspecified: Secondary | ICD-10-CM | POA: Diagnosis present

## 2016-03-05 DIAGNOSIS — Z800 Family history of malignant neoplasm of digestive organs: Secondary | ICD-10-CM | POA: Diagnosis not present

## 2016-03-05 DIAGNOSIS — D649 Anemia, unspecified: Secondary | ICD-10-CM | POA: Diagnosis present

## 2016-03-05 DIAGNOSIS — E119 Type 2 diabetes mellitus without complications: Secondary | ICD-10-CM

## 2016-03-05 DIAGNOSIS — I129 Hypertensive chronic kidney disease with stage 1 through stage 4 chronic kidney disease, or unspecified chronic kidney disease: Secondary | ICD-10-CM | POA: Diagnosis present

## 2016-03-05 DIAGNOSIS — R7989 Other specified abnormal findings of blood chemistry: Secondary | ICD-10-CM | POA: Diagnosis present

## 2016-03-05 DIAGNOSIS — K8020 Calculus of gallbladder without cholecystitis without obstruction: Secondary | ICD-10-CM | POA: Diagnosis present

## 2016-03-05 LAB — SODIUM, URINE, RANDOM: Sodium, Ur: <10 mmol/L

## 2016-03-05 LAB — COMPREHENSIVE METABOLIC PANEL
ALT: 89 U/L — ABNORMAL HIGH (ref 17–63)
ANION GAP: 7 (ref 5–15)
AST: 111 U/L — AB (ref 15–41)
Albumin: 2.8 g/dL — ABNORMAL LOW (ref 3.5–5.0)
Alkaline Phosphatase: 159 U/L — ABNORMAL HIGH (ref 38–126)
BILIRUBIN TOTAL: 0.9 mg/dL (ref 0.3–1.2)
BUN: 30 mg/dL — ABNORMAL HIGH (ref 6–20)
CHLORIDE: 108 mmol/L (ref 101–111)
CO2: 22 mmol/L (ref 22–32)
Calcium: 7.7 mg/dL — ABNORMAL LOW (ref 8.9–10.3)
Creatinine, Ser: 1.63 mg/dL — ABNORMAL HIGH (ref 0.61–1.24)
GFR, EST AFRICAN AMERICAN: 46 mL/min — AB (ref >60)
GFR, EST NON AFRICAN AMERICAN: 40 mL/min — AB (ref >60)
Glucose, Bld: 91 mg/dL (ref 65–99)
POTASSIUM: 3.3 mmol/L — AB (ref 3.5–5.1)
Sodium: 137 mmol/L (ref 135–145)
TOTAL PROTEIN: 5.6 g/dL — AB (ref 6.5–8.1)

## 2016-03-05 LAB — URINALYSIS, ROUTINE W REFLEX MICROSCOPIC
GLUCOSE, UA: NEGATIVE mg/dL
HGB URINE DIPSTICK: NEGATIVE
Ketones, ur: NEGATIVE mg/dL
Nitrite: POSITIVE — AB
PROTEIN: 30 mg/dL — AB
SPECIFIC GRAVITY, URINE: 1.028 (ref 1.005–1.030)
pH: 5.5 (ref 5.0–8.0)

## 2016-03-05 LAB — CBC WITH DIFFERENTIAL/PLATELET
Basophils Absolute: 0 10*3/uL (ref 0.0–0.1)
Basophils Relative: 0 %
EOS ABS: 0.4 10*3/uL (ref 0.0–0.7)
EOS PCT: 5 %
HCT: 28.8 % — ABNORMAL LOW (ref 39.0–52.0)
Hemoglobin: 9.7 g/dL — ABNORMAL LOW (ref 13.0–17.0)
LYMPHS ABS: 1.1 10*3/uL (ref 0.7–4.0)
Lymphocytes Relative: 12 %
MCH: 30.3 pg (ref 26.0–34.0)
MCHC: 33.7 g/dL (ref 30.0–36.0)
MCV: 90 fL (ref 78.0–100.0)
MONO ABS: 0.8 10*3/uL (ref 0.1–1.0)
MONOS PCT: 9 %
Neutro Abs: 6.9 10*3/uL (ref 1.7–7.7)
Neutrophils Relative %: 74 %
PLATELETS: 211 10*3/uL (ref 150–400)
RBC: 3.2 MIL/uL — ABNORMAL LOW (ref 4.22–5.81)
RDW: 14 % (ref 11.5–15.5)
WBC: 9.3 10*3/uL (ref 4.0–10.5)

## 2016-03-05 LAB — FOLATE: Folate: 33.6 ng/mL (ref >5.9)

## 2016-03-05 LAB — CREATININE, URINE, RANDOM: Creatinine, Urine: 323.3 mg/dL

## 2016-03-05 LAB — GLUCOSE, CAPILLARY
GLUCOSE-CAPILLARY: 117 mg/dL — AB (ref 65–99)
GLUCOSE-CAPILLARY: 108 mg/dL — AB (ref 65–99)
GLUCOSE-CAPILLARY: 149 mg/dL — AB (ref 65–99)
GLUCOSE-CAPILLARY: 143 mg/dL — AB (ref 65–99)
Glucose-Capillary: 70 mg/dL (ref 65–99)
Glucose-Capillary: 155 mg/dL — ABNORMAL HIGH (ref 65–99)

## 2016-03-05 LAB — URINE MICROSCOPIC-ADD ON: RBC / HPF: NONE SEEN RBC/hpf (ref 0–5)

## 2016-03-05 LAB — RETICULOCYTES
RBC.: 3.19 MIL/uL — ABNORMAL LOW (ref 4.22–5.81)
RETIC COUNT ABSOLUTE: 63.8 10*3/uL (ref 19.0–186.0)
Retic Ct Pct: 2 % (ref 0.4–3.1)

## 2016-03-05 LAB — HEMATOCRIT: HEMATOCRIT: 28.1 % — AB (ref 39.0–52.0)

## 2016-03-05 LAB — IRON AND TIBC
Iron: 15 ug/dL — ABNORMAL LOW (ref 45–182)
SATURATION RATIOS: 7 % — AB (ref 17.9–39.5)
TIBC: 230 ug/dL — AB (ref 250–450)
UIBC: 215 ug/dL

## 2016-03-05 LAB — PROTIME-INR
INR: 1.69
PROTHROMBIN TIME: 20.1 s — AB (ref 11.4–15.2)

## 2016-03-05 LAB — HEMOGLOBIN: HEMOGLOBIN: 9.6 g/dL — AB (ref 13.0–17.0)

## 2016-03-05 LAB — VITAMIN B12: VITAMIN B 12: 1670 pg/mL — AB (ref 180–914)

## 2016-03-05 LAB — FERRITIN: FERRITIN: 215 ng/mL (ref 24–336)

## 2016-03-05 LAB — AMMONIA: AMMONIA: 10 umol/L (ref 9–35)

## 2016-03-05 MED ORDER — AMLODIPINE BESYLATE 5 MG PO TABS
10.0000 mg | ORAL_TABLET | Freq: Every day | ORAL | Status: DC
  Administered 2016-03-05 – 2016-03-07 (×3): 10 mg via ORAL
  Filled 2016-03-05 (×3): qty 2

## 2016-03-05 MED ORDER — LINACLOTIDE 72 MCG PO CAPS: 72.0000 ug | ORAL_CAPSULE | Freq: Every day | ORAL | Status: DC | PRN

## 2016-03-05 MED ORDER — ONDANSETRON HCL 4 MG PO TABS: 4.0000 mg | ORAL_TABLET | Freq: Four times a day (QID) | ORAL | Status: DC | PRN

## 2016-03-05 MED ORDER — FENTANYL CITRATE (PF) 100 MCG/2ML IJ SOLN: 12.5000 ug | INTRAMUSCULAR | Status: DC | PRN

## 2016-03-05 MED ORDER — ONDANSETRON HCL 4 MG/2ML IJ SOLN: 4.0000 mg | Freq: Four times a day (QID) | INTRAMUSCULAR | Status: DC | PRN

## 2016-03-05 MED ORDER — WARFARIN - PHARMACIST DOSING INPATIENT: Freq: Every day | Status: DC

## 2016-03-05 MED ORDER — BISACODYL 10 MG RE SUPP
10.0000 mg | Freq: Once | RECTAL | Status: AC
Start: 2016-03-05 — End: 2016-03-05
  Administered 2016-03-05: 10 mg via RECTAL
  Filled 2016-03-05: qty 1

## 2016-03-05 MED ORDER — INSULIN ASPART 100 UNIT/ML ~~LOC~~ SOLN
0.0000 [IU] | Freq: Every day | SUBCUTANEOUS | Status: DC
  Administered 2016-03-05: 0 [IU] via SUBCUTANEOUS

## 2016-03-05 MED ORDER — SIMVASTATIN 40 MG PO TABS
40.0000 mg | ORAL_TABLET | Freq: Every day | ORAL | Status: DC
Start: 2016-03-05 — End: 2016-03-07
  Administered 2016-03-05 – 2016-03-07 (×3): 40 mg via ORAL
  Filled 2016-03-05 (×3): qty 1

## 2016-03-05 MED ORDER — SODIUM CHLORIDE 0.9 % IV SOLN
INTRAVENOUS | Status: DC
  Administered 2016-03-05: 125 mL/h via INTRAVENOUS

## 2016-03-05 MED ORDER — ACETAMINOPHEN 650 MG RE SUPP: 650.0000 mg | Freq: Four times a day (QID) | RECTAL | Status: DC | PRN

## 2016-03-05 MED ORDER — ACETAMINOPHEN 325 MG PO TABS: 650.0000 mg | ORAL_TABLET | Freq: Four times a day (QID) | ORAL | Status: DC | PRN

## 2016-03-05 MED ORDER — ESCITALOPRAM OXALATE 10 MG PO TABS
10.0000 mg | ORAL_TABLET | Freq: Every day | ORAL | Status: DC
  Administered 2016-03-05 – 2016-03-07 (×3): 10 mg via ORAL
  Filled 2016-03-05 (×3): qty 1

## 2016-03-05 MED ORDER — WARFARIN SODIUM 5 MG PO TABS
5.0000 mg | ORAL_TABLET | ORAL | Status: AC
  Administered 2016-03-05: 5 mg via ORAL
  Filled 2016-03-05: qty 1

## 2016-03-05 MED ORDER — PANTOPRAZOLE SODIUM 40 MG PO TBEC: 40.0000 mg | DELAYED_RELEASE_TABLET | Freq: Two times a day (BID) | ORAL | Status: DC

## 2016-03-05 MED ORDER — INSULIN ASPART 100 UNIT/ML ~~LOC~~ SOLN
0.0000 [IU] | Freq: Three times a day (TID) | SUBCUTANEOUS | Status: DC
  Administered 2016-03-05: 1 [IU] via SUBCUTANEOUS
  Administered 2016-03-05 – 2016-03-06 (×2): 2 [IU] via SUBCUTANEOUS
  Administered 2016-03-07: 3 [IU] via SUBCUTANEOUS
  Administered 2016-03-07: 2 [IU] via SUBCUTANEOUS

## 2016-03-05 MED ORDER — SENNOSIDES-DOCUSATE SODIUM 8.6-50 MG PO TABS
2.0000 | ORAL_TABLET | Freq: Two times a day (BID) | ORAL | Status: DC
  Administered 2016-03-05 – 2016-03-06 (×2): 2 via ORAL
  Filled 2016-03-05 (×3): qty 2

## 2016-03-05 MED ORDER — WARFARIN SODIUM 5 MG PO TABS
5.0000 mg | ORAL_TABLET | Freq: Every day | ORAL | Status: DC
  Administered 2016-03-05: 5 mg via ORAL
  Filled 2016-03-05: qty 1

## 2016-03-05 MED ORDER — GABAPENTIN 300 MG PO CAPS
300.0000 mg | ORAL_CAPSULE | Freq: Two times a day (BID) | ORAL | Status: DC
  Administered 2016-03-06 – 2016-03-07 (×3): 300 mg via ORAL
  Filled 2016-03-05 (×3): qty 1

## 2016-03-05 MED ORDER — FAMOTIDINE IN NACL 20-0.9 MG/50ML-% IV SOLN: 20.0000 mg | Freq: Two times a day (BID) | INTRAVENOUS | Status: DC

## 2016-03-05 MED ORDER — ADULT MULTIVITAMIN W/MINERALS CH
1.0000 | ORAL_TABLET | Freq: Every day | ORAL | Status: DC
  Administered 2016-03-05 – 2016-03-07 (×3): 1 via ORAL
  Filled 2016-03-05 (×3): qty 1

## 2016-03-05 MED ORDER — SODIUM CHLORIDE 0.9 % IV SOLN: INTRAVENOUS | Status: DC

## 2016-03-05 MED ORDER — SODIUM CHLORIDE 0.9 % IV SOLN
INTRAVENOUS | Status: AC
  Administered 2016-03-05: 14:00:00 via INTRAVENOUS
  Filled 2016-03-05 (×4): qty 1000

## 2016-03-05 MED ORDER — PANTOPRAZOLE SODIUM 40 MG IV SOLR
40.0000 mg | Freq: Two times a day (BID) | INTRAVENOUS | Status: DC
  Administered 2016-03-05 – 2016-03-07 (×6): 40 mg via INTRAVENOUS
  Filled 2016-03-05 (×6): qty 40

## 2016-03-05 MED ORDER — CALCIUM CARBONATE-VITAMIN D 500-200 MG-UNIT PO TABS
1.0000 | ORAL_TABLET | Freq: Every day | ORAL | Status: DC
  Administered 2016-03-05 – 2016-03-07 (×3): 1 via ORAL
  Filled 2016-03-05 (×3): qty 1

## 2016-03-05 NOTE — Progress Notes (Signed)
03/04/2016 10:14 PM  03/05/2016 10:25 AM  Johnathan KannerWilliam R Chavez was seen and examined.  The H&P by the admitting provider , orders, imaging was reviewed.  Please see orders.  Will continue to follow.   Maryln Manuel. Johnson, MD [facility]

## 2016-03-05 NOTE — H&P (Signed)
History and Physical    Johnathan Chavez JQB:[id number] DOB: February 18, 1941 DOA: 03/04/2016  PCP: Kendrick Ranch, MD   Patient coming from: Home  Chief Complaint: Right-sided abdominal pain   HPI: Johnathan Chavez is a 75 y.o. male with medical history significant for type 2 diabetes mellitus, chronic kidney disease stage II, atrial fibrillation on Coumadin, and GERD who presents to the emergency department with pain in the right upper quadrant abdomen and right flank. Patient had just been admitted to a hospital in [place] where he was managed for acute pancreatitis and discharged on 03/03/2016 with normal LFTs and a serum creatinine 1.23. Etiology of the pancreatitis was not identified as the patient denied alcohol use and no gallstones were identified. It is not clear if triglycerides were evaluated. The patient was experiencing pain in the midabdomen and epigastrium that had resolved by the time of his hospital discharge, but he had developed pain in the right flank and right upper quadrant around that same time that he was being discharged. The pain has persisted with no alleviating or exacerbating factors identified, and he was evaluated for it at an urgent care today. He was told that there were some new abnormalities on his blood work and he was directed to the emergency department for further evaluation. The patient denies any recent fevers or chills and denies vomiting or diarrhea. He has continued to maintain an appetite and denies any worsening in his symptoms with meals. He denies any recent new medications, alcohol use, or trauma.  ED Course: Upon arrival to the ED, patient is found to be afebrile, saturating well on room air, and with vital signs stable. Chemistry panels notable for a serum creatinine 1.90, up from 1.23 on 03/03/2016. Also notable on CMP is elevation in alkaline phosphatase 281, AST 149, ALT 91, and total bilirubin 1.6. LFTs were normal at time of the recent  hospital discharge. CBC features a leukocytosis to 12,100 and a normocytic anemia with hemoglobin of 10.9. Lipase is within the normal limits and urinalysis feature some bacteria and positive nitrite, but no significant WBCs. CT of the abdomen and pelvis was obtained and is notable for small bilateral pleural effusions with basilar atelectasis and infiltration around the pancreas suggestive of acute pancreatitis. Also noted on CT his duodenal thickening suggestive of duodenitis. Patient was treated with 1 L of normal saline in the ED and given a dose of tramadol. Patient has remained hemodynamically stable in the ED and not in any respiratory distress. He will be admitted to the medical/surgical unit for ongoing evaluation and management of acute pain in the right upper quadrant of the abdomen and right flank with new elevations in LFTs and acute kidney injury.  Review of Systems:  All other systems reviewed and apart from HPI, are negative.  Past Medical History:  Diagnosis Date  . A-fib ([place])   . CAD (coronary artery disease)   . CKD (chronic kidney disease) stage 2, GFR 60-89 ml/min   . GERD (gastroesophageal reflux disease)   . HTN (hypertension)   . Hyperlipidemia   . Type 2 DM with CKD stage 2 and hypertension ([place])     Past Surgical History:  Procedure Laterality Date  . APPENDECTOMY    . BACK SURGERY     X2  . CERVICAL DISC SURGERY    . COLONOSCOPY  04/2014   [facility]: moderate sized internal hemorrhoids, 2 tubular adenomas removed from colon. diverticulosis  . CORONARY ARTERY BYPASS GRAFT  2001  .  ESOPHAGOGASTRODUODENOSCOPY  01/2015   Dr. Posey Pronto: gastritis  . INGUINAL HERNIA REPAIR Left   . SHOULDER ARTHROSCOPY Right   . SIGMOIDOSCOPY  01/2015   Dr. Posey Pronto: internal hemorrhoids  . TONSILLECTOMY       reports that he has never smoked. He has never used smokeless tobacco. He reports that he does not drink alcohol or use drugs.  Allergies  Allergen Reactions   . Codeine Nausea Only    Family History  Problem Relation Age of Onset  . Diabetes Mother   . Colon cancer Father     ?colon cancer, age early 15     Prior to Admission medications   Medication Sig Start Date End Date Taking? Authorizing Provider  amLODipine (NORVASC) 10 MG tablet Take 10 mg by mouth daily. 12/08/15  Yes Historical Provider, MD  aspirin (GOODSENSE ASPIRIN) 325 MG tablet Take 325 mg by mouth daily.    Yes Historical Provider, MD  calcium-vitamin D (SM CALCIUM 500/VITAMIN D3) 500-400 MG-UNIT tablet Take 1 tablet by mouth daily.    Yes Historical Provider, MD  Cyanocobalamin (VITAMIN B 12 PO) Take 1,000 mg by mouth daily.   Yes Historical Provider, MD  escitalopram (LEXAPRO) 10 MG tablet Take 10 mg by mouth daily.   Yes Historical Provider, MD  gabapentin (NEURONTIN) 300 MG capsule Take 300 mg by mouth 2 (two) times daily.    Yes Historical Provider, MD  Ginkgo Biloba 60 MG TABS Take 60 mg by mouth daily.   Yes Historical Provider, MD  glipiZIDE (GLUCOTROL) 10 MG tablet Take 10 mg by mouth 3 (three) times daily.   Yes Historical Provider, MD  Glucosamine-Chondroit-Vit C-Mn (GLUCOSAMINE 1500 COMPLEX PO) Take 1,500 mg by mouth daily.   Yes Historical Provider, MD  linaclotide (LINZESS) 72 MCG capsule Take 72 mcg by mouth daily as needed (hard stool).    Yes Historical Provider, MD  lisinopril (PRINIVIL,ZESTRIL) 20 MG tablet Take 20 mg by mouth daily.    Yes Historical Provider, MD  metFORMIN (GLUCOPHAGE) 1000 MG tablet Take 1,000 mg by mouth 2 (two) times daily.   Yes Historical Provider, MD  Multiple Vitamin (MULTIVITAMIN WITH MINERALS) TABS tablet Take 1 tablet by mouth daily.   Yes Historical Provider, MD  Omega-3 Fatty Acids (FISH OIL) 1200 MG CAPS Take 1,200 mg by mouth daily.   Yes Historical Provider, MD  omeprazole (PRILOSEC) 20 MG capsule Take 20 mg by mouth daily.    Yes Historical Provider, MD  pantoprazole (PROTONIX) 40 MG tablet Take 40 mg by mouth 2 (two) times  daily.   Yes Historical Provider, MD  simvastatin (ZOCOR) 40 MG tablet Take 40 mg by mouth daily.    Yes Historical Provider, MD  traMADol (ULTRAM) 50 MG tablet Take 50 mg by mouth 2 (two) times daily as needed for moderate pain.  10/13/15  Yes Historical Provider, MD  vitamin C (ASCORBIC ACID) 500 MG tablet Take 500 mg by mouth daily.    Yes Historical Provider, MD  warfarin (COUMADIN) 5 MG tablet Take 5 mg by mouth daily.    Yes Historical Provider, MD  FLUZONE HIGH-DOSE 0.5 ML SUSY TO BE ADMINISTERED BY PHARMACIST FOR IMMUNIZATION 02/19/16   Historical Provider, MD  PREVNAR 13 SUSP injection TO BE ADMINISTERED BY PHARMACIST FOR IMMUNIZATION 02/19/16   Historical Provider, MD    Physical Exam: Vitals:   03/04/16 2210 03/04/16 2211 03/04/16 2335  BP: 103/91  116/59  Pulse: 71  64  Resp: 17  17  Temp:  97.8 F (36.6 C)    TempSrc: Oral    SpO2: 97%  100%  Weight:  81.6 kg (180 lb)   Height:  _0  (1.651 m)       Constitutional: NAD, calm, appears uncomfortable Eyes: PERTLA, lids and conjunctivae normal ENMT: Mucous membranes are moist. Posterior pharynx clear of any exudate or lesions.   Neck: normal, supple, no masses, no thyromegaly Respiratory: clear to auscultation bilaterally, no wheezing, no crackles. Normal respiratory effort.    Cardiovascular: S1 & S2 heard, regular rate and rhythm. Trace bilateral LE edema. No significant JVD. Abdomen: No distension, tender in right upper and lower quadrants, as well as right flank, no rebound tenderness or guarding, no masses palpated. Bowel sounds normal.  Musculoskeletal: no clubbing / cyanosis. No joint deformity upper and lower extremities. Normal muscle tone.  Skin: no significant rashes, lesions, ulcers. Warm, dry, well-perfused. Neurologic: CN 2-12 grossly intact. Sensation intact, DTR normal. Strength 5/5 in all 4 limbs.  Psychiatric: Normal judgment and insight. Alert and oriented x 3. Normal mood and affect.     Labs on  Admission: I have personally reviewed following labs and imaging studies  CBC:  Recent Labs Lab 03/04/16 2232  WBC 12.1*  NEUTROABS 9.5*  HGB 10.9*  HCT 32.2*  MCV 89.4  PLT 292   Basic Metabolic Panel:  Recent Labs Lab 03/04/16 2232  NA 135  K 4.0  CL 105  CO2 22  GLUCOSE 129*  BUN 32*  CREATININE 1.90*  CALCIUM 8.1*   GFR: Estimated Creatinine Clearance: 33.5 mL/min (by C-G formula based on SCr of 1.9 mg/dL (H)). Liver Function Tests:  Recent Labs Lab 03/04/16 2232  AST 149*  ALT 91*  ALKPHOS 181*  BILITOT 1.6*  PROT 6.8  ALBUMIN 3.5    Recent Labs Lab 03/04/16 2232  LIPASE 28   No results for input(s): AMMONIA in the last 168 hours. Coagulation Profile: No results for input(s): INR, PROTIME in the last 168 hours. Cardiac Enzymes: No results for input(s): CKTOTAL, CKMB, CKMBINDEX, TROPONINI in the last 168 hours. BNP (last 3 results) No results for input(s): PROBNP in the last 8760 hours. HbA1C: No results for input(s): HGBA1C in the last 72 hours. CBG: No results for input(s): GLUCAP in the last 168 hours. Lipid Profile: No results for input(s): CHOL, HDL, LDLCALC, TRIG, CHOLHDL, LDLDIRECT in the last 72 hours. Thyroid Function Tests: No results for input(s): TSH, T4TOTAL, FREET4, T3FREE, THYROIDAB in the last 72 hours. Anemia Panel: No results for input(s): VITAMINB12, FOLATE, FERRITIN, TIBC, IRON, RETICCTPCT in the last 72 hours. Urine analysis:    Component Value Date/Time   COLORURINE ORANGE (A) 03/04/2016 2342   APPEARANCEUR CLOUDY (A) 03/04/2016 2342   LABSPEC 1.028 03/04/2016 2342   PHURINE 5.5 03/04/2016 2342   GLUCOSEU NEGATIVE 03/04/2016 2342   HGBUR NEGATIVE 03/04/2016 2342   BILIRUBINUR MODERATE (A) 03/04/2016 2342   [place] 03/04/2016 2342   PROTEINUR 30 (A) 03/04/2016 2342   NITRITE POSITIVE (A) 03/04/2016 2342   LEUKOCYTESUR SMALL (A) 03/04/2016 2342   Sepsis  Labs: _1 (procalcitonin:4,lacticidven:4) )No results found for this or any previous visit (from the past 240 hour(s)).   Radiological Exams on Admission: Ct Abdomen Pelvis Wo Contrast  Result Date: 03/05/2016 CLINICAL DATA:  Right abdominal and right flank pain. Elevated liver function studies. Recent episode of pancreatitis. Patient was seen at urgent care in [facility] and discharge from [facility] on 03/03/2016. EXAM: CT ABDOMEN AND PELVIS WITHOUT CONTRAST TECHNIQUE:  Multidetector CT imaging of the abdomen and pelvis was performed following the standard protocol without IV contrast. COMPARISON:  None. FINDINGS: Lower chest: Small bilateral pleural effusions with basilar atelectasis, greater on the left. Postoperative changes in the mediastinum. Small esophageal hiatal hernia. Hepatobiliary: No focal liver abnormality is seen. No gallstones, gallbladder wall thickening, or biliary dilatation. Pancreas: Mild infiltration in the fat around the pancreas consistent with mild acute pancreatitis. No pancreatic ductal dilatation. No loculated fluid collections. Spleen: Normal in size without focal abnormality. Adrenals/Urinary Tract: No adrenal gland nodules. Prominent vascular calcifications demonstrated in the renal sinus arteries bilaterally. No hydronephrosis or hydroureter. No renal, ureteral, or bladder stones. Bladder is decompressed without obvious bladder wall thickening. Prominent para renal fat bilaterally. Stomach/Bowel: Stomach is unremarkable. Small bowel are decompressed but there appears to be wall thickening in the duodenum suggesting duodenitis. Scattered stool throughout the colon without distention or apparent wall thickening. Appendix is not identified. Vascular/Lymphatic: Aortic atherosclerosis. No enlarged abdominal or pelvic lymph nodes. Extensive vascular calcifications throughout the abdomen and pelvis. Reproductive: Prostate gland is enlarged, measuring 4.6 cm  diameter. Other: No abdominal wall hernia or abnormality. No abdominopelvic ascites. Musculoskeletal: Postoperative and degenerative changes in the lumbar spine. No destructive bone lesions. IMPRESSION: Small bilateral pleural effusions with basilar atelectasis, greater on the left. Infiltration in the fat around the pancreas suggesting acute pancreatitis. Duodenal wall thickening suggesting duodenitis. No evidence of bowel obstruction. No renal or ureteral stone or obstruction. Diffuse vascular calcifications. Electronically Signed   By: Lucienne Capers M.D.   On: 03/05/2016 00:15    EKG: Not performed, will obtain as appropriate  Assessment/Plan  1. Acute pancreatitis  - The patient has pain and radiographic evidence for pancreatitis but lipase is normal and there is some diagnostic uncertainty  - With newly elevated LFTs, concern for obstructing stone - Pt denies alcohol use; RUQ [place] ordered to look for stones, none seen on CT; triglyceride level pending  - Plan to continue IVF hydration and pain-control, follow-up the pending studies   2. Elevated LFTs  - There is elevation in alk phos, transaminases, and total bilirubin that appears new from 03/03/16  - Hepatobiliary system appears unremarkable on CT; RUQ [place] pending  - Repeat CMP in am    3. AKI superimposed on CKD stage II  - SCr is 1.9 on admission, up from 1.2 on on 03/03/16  - Abd CT without hydronephrosis or hydroureter; no radiopaque stones seen  - He received a liter of NS in ED and IVF hydration will be continued  - Hold lisinopril; repeat chem panel in am    4. GERD, duodenitis   - No EGD report on file  - There is evidence of duodenitis on CT  - Continue BID Protonix, converted to IV for now while not eating    5. Atrial fibrillation  - In a regular rhythm on admission  - CHADS-VASc at least 4 (age, CAD, HTN, DM)  - Continue warfarin with pharmacy to assist with dosing  - Has not require rate-control agent   6. CAD -  No anginal complaints  - Continue Zocor, resume lisinopril once renal fxn stabilizes    7. Type II DM  - No A1c on file  - Managed with glipizide and metformin at home; these will be held in the hospital  - Check CBG with meals and qHS  - Start with a low-intensity sliding-scale insulin only and adjust prn    8. Normocytic anemia  - Hgb  10.9 on admission with no prior for comparison  - No s/s of bleeding, no pallor  - Anemia panel ordered and pending    DVT prophylaxis: warfarin  Code Status: Full  Family Communication: Wife updated at bedside  Disposition Plan: Admit to med-surg Consults called: None Admission status: Inpatient    Vianne Bulls, MD [facility] Pager [phone]  If 7PM-7AM, please contact night-coverage [email or web address] Password [email or web address]  03/05/2016, 1:13 AM

## 2016-03-05 NOTE — ED Notes (Addendum)
Attempted lab draw x 2 but unsuccessful. RN,Jeneen made aware. Called Main lab talked to Rodney Boozeasha to obtain labs.

## 2016-03-05 NOTE — ED Notes (Signed)
Unsuccessful attempt to draw labs. Nurse informed. 

## 2016-03-05 NOTE — Progress Notes (Signed)
ANTICOAGULATION CONSULT NOTE - Initial Consult  Pharmacy Consult for warfarin Indication: atrial fibrillation  Allergies  Allergen Reactions  . Codeine Nausea Only    Patient Measurements: Height: 5\' 5"  (165.1 cm) Weight: 193 lb 2 oz (87.6 kg) IBW/kg (Calculated) : 61.5 Heparin Dosing Weight:   Vital Signs: Temp: 97.7 F (36.5 C) (11/11 0242) Temp Source: Oral (11/11 0242) BP: 111/55 (11/11 0242) Pulse Rate: 62 (11/11 0242)  Labs:  Recent Labs  03/04/16 2232  HGB 10.9*  HCT 32.2*  PLT 240  LABPROT 20.1*  INR 1.69  CREATININE 1.90*    Estimated Creatinine Clearance: 34.7 mL/min (by C-G formula based on SCr of 1.9 mg/dL (H)).   Medical History: Past Medical History:  Diagnosis Date  . A-fib ([facility])   . CAD (coronary artery disease)   . CKD (chronic kidney disease) stage 2, GFR 60-89 ml/min   . GERD (gastroesophageal reflux disease)   . HTN (hypertension)   . Hyperlipidemia   . Type 2 DM with CKD stage 2 and hypertension ([facility])     Medications:  Prescriptions Prior to Admission  Medication Sig Dispense Refill Last Dose  . amLODipine (NORVASC) 10 MG tablet Take 10 mg by mouth daily.  0 03/04/2016 at Unknown time  . aspirin (GOODSENSE ASPIRIN) 325 MG tablet Take 325 mg by mouth daily.    03/03/2016 at Unknown time  . calcium-vitamin D (SM CALCIUM 500/VITAMIN D3) 500-400 MG-UNIT tablet Take 1 tablet by mouth daily.    Past Week at Unknown time  . Cyanocobalamin (VITAMIN B 12 PO) Take 1,000 mg by mouth daily.   Past Week at Unknown time  . escitalopram (LEXAPRO) 10 MG tablet Take 10 mg by mouth daily.   03/04/2016 at Unknown time  . gabapentin (NEURONTIN) 300 MG capsule Take 300 mg by mouth 2 (two) times daily.    03/04/2016 at Unknown time  . Ginkgo Biloba 60 MG TABS Take 60 mg by mouth daily.   Past Week at Unknown time  . glipiZIDE (GLUCOTROL) 10 MG tablet Take 10 mg by mouth 3 (three) times daily.   03/04/2016 at Unknown time  . Glucosamine-Chondroit-Vit C-Mn  (GLUCOSAMINE 1500 COMPLEX PO) Take 1,500 mg by mouth daily.   Past Week at Unknown time  . linaclotide (LINZESS) 72 MCG capsule Take 72 mcg by mouth daily as needed (hard stool).    unknown  . lisinopril (PRINIVIL,ZESTRIL) 20 MG tablet Take 20 mg by mouth daily.    03/04/2016 at Unknown time  . metFORMIN (GLUCOPHAGE) 1000 MG tablet Take 1,000 mg by mouth 2 (two) times daily.   03/04/2016 at Unknown time  . Multiple Vitamin (MULTIVITAMIN WITH MINERALS) TABS tablet Take 1 tablet by mouth daily.     . Omega-3 Fatty Acids (FISH OIL) 1200 MG CAPS Take 1,200 mg by mouth daily.   Past Week at Unknown time  . omeprazole (PRILOSEC) 20 MG capsule Take 20 mg by mouth daily.    03/04/2016 at Unknown time  . pantoprazole (PROTONIX) 40 MG tablet Take 40 mg by mouth 2 (two) times daily.   03/04/2016 at Unknown time  . simvastatin (ZOCOR) 40 MG tablet Take 40 mg by mouth daily.    03/03/2016 at Unknown time  . traMADol (ULTRAM) 50 MG tablet Take 50 mg by mouth 2 (two) times daily as needed for moderate pain.   0 unknown  . vitamin C (ASCORBIC ACID) 500 MG tablet Take 500 mg by mouth daily.    Past Week at Unknown  time  . warfarin (COUMADIN) 5 MG tablet Take 5 mg by mouth daily.    03/03/2016 at 2200  . FLUZONE HIGH-DOSE 0.5 ML SUSY TO BE ADMINISTERED BY PHARMACIST FOR IMMUNIZATION  0   . PREVNAR 13 SUSP injection TO BE ADMINISTERED BY PHARMACIST FOR IMMUNIZATION  0    Scheduled:  . amLODipine  10 mg Oral Daily  . calcium-vitamin D  1 tablet Oral Daily  . escitalopram  10 mg Oral Daily  . [START ON 03/06/2016] gabapentin  300 mg Oral BID  . insulin aspart  0-5 Units Subcutaneous QHS  . insulin aspart  0-9 Units Subcutaneous TID WC  . multivitamin with minerals  1 tablet Oral Daily  . pantoprazole (PROTONIX) IV  40 mg Intravenous BID  . simvastatin  40 mg Oral Daily  . warfarin  5 mg Oral q1800  . warfarin  5 mg Oral NOW  . Warfarin - Pharmacist Dosing Inpatient   Does not apply q1800     Assessment: Patient with chronic warfarin for afib.  INR < 2 on admit.    Goal of Therapy:  INR 2-3 Monitor platelets by anticoagulation protocol: Yes   Plan:  Warfarin 5mg  x1 now, then daily at 1800 Daily INR  Aleene DavidsonGrimsley Jr, Benito Lemmerman Crowford 03/05/2016,6:09 AM

## 2016-03-06 ENCOUNTER — Inpatient Hospital Stay (HOSPITAL_COMMUNITY): Payer: Medicare Other

## 2016-03-06 DIAGNOSIS — I4891 Unspecified atrial fibrillation: Secondary | ICD-10-CM

## 2016-03-06 DIAGNOSIS — I2510 Atherosclerotic heart disease of native coronary artery without angina pectoris: Secondary | ICD-10-CM

## 2016-03-06 DIAGNOSIS — E119 Type 2 diabetes mellitus without complications: Secondary | ICD-10-CM

## 2016-03-06 DIAGNOSIS — I2583 Coronary atherosclerosis due to lipid rich plaque: Secondary | ICD-10-CM

## 2016-03-06 DIAGNOSIS — R7989 Other specified abnormal findings of blood chemistry: Secondary | ICD-10-CM

## 2016-03-06 DIAGNOSIS — K2980 Duodenitis without bleeding: Secondary | ICD-10-CM

## 2016-03-06 DIAGNOSIS — N183 Chronic kidney disease, stage 3 (moderate): Secondary | ICD-10-CM

## 2016-03-06 LAB — COMPREHENSIVE METABOLIC PANEL
ALBUMIN: 2.7 g/dL — AB (ref 3.5–5.0)
ALK PHOS: 176 U/L — AB (ref 38–126)
ALT: 93 U/L — ABNORMAL HIGH (ref 17–63)
ANION GAP: 5 (ref 5–15)
AST: 85 U/L — ABNORMAL HIGH (ref 15–41)
BILIRUBIN TOTAL: 1.5 mg/dL — AB (ref 0.3–1.2)
BUN: 21 mg/dL — ABNORMAL HIGH (ref 6–20)
CALCIUM: 7.9 mg/dL — AB (ref 8.9–10.3)
CO2: 21 mmol/L — ABNORMAL LOW (ref 22–32)
Chloride: 110 mmol/L (ref 101–111)
Creatinine, Ser: 1.38 mg/dL — ABNORMAL HIGH (ref 0.61–1.24)
GFR calc non Af Amer: 49 mL/min — ABNORMAL LOW (ref >60)
GFR, EST AFRICAN AMERICAN: 57 mL/min — AB (ref >60)
GLUCOSE: 133 mg/dL — AB (ref 65–99)
POTASSIUM: 4.1 mmol/L (ref 3.5–5.1)
Sodium: 136 mmol/L (ref 135–145)
TOTAL PROTEIN: 5.9 g/dL — AB (ref 6.5–8.1)

## 2016-03-06 LAB — CBC WITH DIFFERENTIAL/PLATELET
BASOS PCT: 0 %
Basophils Absolute: 0 10*3/uL (ref 0.0–0.1)
EOS ABS: 0.3 10*3/uL (ref 0.0–0.7)
Eosinophils Relative: 3 %
HEMATOCRIT: 27.6 % — AB (ref 39.0–52.0)
HEMOGLOBIN: 9.5 g/dL — AB (ref 13.0–17.0)
LYMPHS ABS: 1.3 10*3/uL (ref 0.7–4.0)
Lymphocytes Relative: 15 %
MCH: 29.6 pg (ref 26.0–34.0)
MCHC: 34.4 g/dL (ref 30.0–36.0)
MCV: 86 fL (ref 78.0–100.0)
MONO ABS: 0.6 10*3/uL (ref 0.1–1.0)
MONOS PCT: 7 %
NEUTROS ABS: 6.2 10*3/uL (ref 1.7–7.7)
Neutrophils Relative %: 75 %
Platelets: 215 10*3/uL (ref 150–400)
RBC: 3.21 MIL/uL — ABNORMAL LOW (ref 4.22–5.81)
RDW: 13.5 % (ref 11.5–15.5)
WBC: 8.3 10*3/uL (ref 4.0–10.5)

## 2016-03-06 LAB — GLUCOSE, CAPILLARY
GLUCOSE-CAPILLARY: 151 mg/dL — AB (ref 65–99)
GLUCOSE-CAPILLARY: 199 mg/dL — AB (ref 65–99)
Glucose-Capillary: 107 mg/dL — ABNORMAL HIGH (ref 65–99)
Glucose-Capillary: 156 mg/dL — ABNORMAL HIGH (ref 65–99)

## 2016-03-06 LAB — LIPID PANEL
CHOLESTEROL: 116 mg/dL (ref 0–200)
HDL: 25 mg/dL — AB (ref >40)
LDL Cholesterol: 64 mg/dL (ref 0–99)
TRIGLYCERIDES: 137 mg/dL (ref <150)
Total CHOL/HDL Ratio: 4.6 RATIO
VLDL: 27 mg/dL (ref 0–40)

## 2016-03-06 LAB — URINE CULTURE: Culture: NO GROWTH

## 2016-03-06 LAB — HEMOGLOBIN A1C
Hgb A1c MFr Bld: 6.4 % — ABNORMAL HIGH (ref 4.8–5.6)
Mean Plasma Glucose: 137 mg/dL

## 2016-03-06 LAB — LIPASE, BLOOD: LIPASE: 20 U/L (ref 11–51)

## 2016-03-06 LAB — PROTIME-INR
INR: 2.76
PROTHROMBIN TIME: 29.8 s — AB (ref 11.4–15.2)

## 2016-03-06 LAB — UREA NITROGEN, URINE: UREA NITROGEN UR: 496 mg/dL

## 2016-03-06 MED ORDER — GADOBENATE DIMEGLUMINE 529 MG/ML IV SOLN
18.0000 mL | Freq: Once | INTRAVENOUS | Status: AC | PRN
  Administered 2016-03-06: 18 mL via INTRAVENOUS

## 2016-03-06 NOTE — Progress Notes (Signed)
PROGRESS NOTE    DAYMIAN LILL  DEY:[id number]  DOB: 1941-03-05  DOA: 03/04/2016 PCP: Kendrick Ranch, MD Outpatient Specialists:  Hospital course:  CEDRIC MCCLAINE is a 75 y.o. male with medical history significant for type 2 diabetes mellitus, chronic kidney disease stage II, atrial fibrillation on Coumadin, and GERD who presents to the emergency department with pain in the right upper quadrant abdomen and right flank. Patient had just been admitted to a hospital in [place] where he was managed for acute pancreatitis and discharged on 03/03/2016 with normal LFTs and a serum creatinine 1.23. Etiology of the pancreatitis was not identified as the patient denied alcohol use and no gallstones were identified. It is not clear if triglycerides were evaluated. The patient was experiencing pain in the midabdomen and epigastrium that had resolved by the time of his hospital discharge, but he had developed pain in the right flank and right upper quadrant around that same time that he was being discharged. The pain has persisted with no alleviating or exacerbating factors identified, and he was evaluated for it at an urgent care today. He was told that there were some new abnormalities on his blood work and he was directed to the emergency department for further evaluation. The patient denies any recent fevers or chills and denies vomiting or diarrhea. He has continued to maintain an appetite and denies any worsening in his symptoms with meals. He denies any recent new medications, alcohol use, or trauma.  Assessment & Plan:   1. Acute pancreatitis  - The patient has pain and radiographic evidence for pancreatitis but lipase is normal and there is some diagnostic uncertainty  - With newly elevated LFTs, concern for obstructing stone - Pt denies alcohol use; RUQ [place] ordered to look for stones, none seen on CT; triglyceride level pending  - Plan to continue IVF hydration and  pain-control  2. Elevated LFTs  - There is elevation in alk phos, transaminases, and total bilirubin that appears new from 03/03/16  - Hepatobiliary system appears unremarkable on CT; RUQ [place] pending  - I spoke with GI, gallbladder sludge and small stones can be causing the recurrent pancreatitis and elevated levels, likely is going to need cholecystectomy, asked to get MRCP and if negative will need general surgery evaluation for cholecystectomy.      3. AKI superimposed on CKD stage II  - SCr is 1.9 on admission, up from 1.2 on on 03/03/16  - Abd CT without hydronephrosis or hydroureter; no radiopaque stones seen  - He received a liter of NS in ED and IVF hydration will be continued  - Hold lisinopril; repeat chem panel in am    4. GERD, duodenitis   - No EGD report on file  - There is evidence of duodenitis on CT  - Continue BID Protonix    5. Atrial fibrillation  - In a regular rhythm on admission  - CHADS-VASc at least 4 (age, CAD, HTN, DM)  - Continue warfarin with pharmacy to assist with dosing  - Has not require rate-control agent   6. CAD - No anginal complaints  - Continue Zocor, resume lisinopril once renal fxn stabilizes    7. Type II DM  - No A1c on file  - Managed with glipizide and metformin at home; these will be held in the hospital  - Check CBG with meals and qHS  - Start with a low-intensity sliding-scale insulin only and adjust prn    8. Normocytic anemia  -  Hgb 10.9 on admission with no prior for comparison  - No s/s of bleeding, no pallor  - Anemia panel ordered and pending    DVT prophylaxis: warfarin  Code Status: Full  Family Communication: Wife updated at bedside  Disposition Plan: Admit to med-surg Consults called: None  Subjective: Pt says he's not feeling great but tolerating clears.   Objective: Vitals:   03/05/16 0611 03/05/16 1506 03/05/16 2159 03/06/16 0557  BP: 118/65 (!) 131/55 (!) 147/75 135/61  Pulse: 63 60 70 64  Resp:  18 18 16 16   Temp: 97.6 F (36.4 C) 99.1 F (37.3 C) 99.5 F (37.5 C) 99 F (37.2 C)  TempSrc: Oral Oral Oral Oral  SpO2: 94% 94% 91% 95%  Weight:      Height:        Intake/Output Summary (Last 24 hours) at 03/06/16 1433 Last data filed at 03/06/16 1000  Gross per 24 hour  Intake           2568.4 ml  Output             2800 ml  Net           -231.6 ml   Filed Weights   03/04/16 2211 03/05/16 0242  Weight: 81.6 kg (180 lb) 87.6 kg (193 lb 2 oz)    Exam:  General exam: awake, alert, no distress Respiratory system: Clear. No increased work of breathing. Cardiovascular system: S1 & S2 heard, RRR. No JVD, murmurs, gallops, clicks or pedal edema. Gastrointestinal system: Abdomen is nondistended, soft and nontender. Normal bowel sounds heard. Central nervous system: Alert and oriented. No focal neurological deficits. Extremities: no CCE.  Data Reviewed: Basic Metabolic Panel:  Recent Labs Lab 03/04/16 2232 03/05/16 0538 03/06/16 0352  NA 135 137 136  K 4.0 3.3* 4.1  CL 105 108 110  CO2 22 22 21*  GLUCOSE 129* 91 133*  BUN 32* 30* 21*  CREATININE 1.90* 1.63* 1.38*  CALCIUM 8.1* 7.7* 7.9*   Liver Function Tests:  Recent Labs Lab 03/04/16 2232 03/05/16 0538 03/06/16 0352  AST 149* 111* 85*  ALT 91* 89* 93*  ALKPHOS 181* 159* 176*  BILITOT 1.6* 0.9 1.5*  PROT 6.8 5.6* 5.9*  ALBUMIN 3.5 2.8* 2.7*    Recent Labs Lab 03/04/16 2232 03/06/16 0352  LIPASE 28 20    Recent Labs Lab 03/05/16 0231  AMMONIA 10   CBC:  Recent Labs Lab 03/04/16 2232 03/05/16 0538 03/06/16 0352  WBC 12.1* 9.3 8.3  NEUTROABS 9.5* 6.9 6.2  HGB 10.9* 9.7*  9.6* 9.5*  HCT 32.2* 28.8*  28.1* 27.6*  MCV 89.4 90.0 86.0  PLT 240 211 215   Cardiac Enzymes: No results for input(s): CKTOTAL, CKMB, CKMBINDEX, TROPONINI in the last 168 hours. CBG (last 3)   Recent Labs  03/05/16 2110 03/06/16 0747 03/06/16 1142  GLUCAP 143* 107* 151*   Recent Results (from the past  240 hour(s))  Urine culture     Status: None   Collection Time: 03/04/16 11:45 PM  Result Value Ref Range Status   Specimen Description URINE, CLEAN CATCH  Final   Special Requests NONE  Final   Culture NO GROWTH Performed at [facility]   Final   Report Status 03/06/2016 FINAL  Final     Studies: Ct Abdomen Pelvis Wo Contrast  Result Date: 03/05/2016 CLINICAL DATA:  Right abdominal and right flank pain. Elevated liver function studies. Recent episode of pancreatitis. Patient was seen at urgent care  in [place] and discharge from [facility] on 03/03/2016. EXAM: CT ABDOMEN AND PELVIS WITHOUT CONTRAST TECHNIQUE: Multidetector CT imaging of the abdomen and pelvis was performed following the standard protocol without IV contrast. COMPARISON:  None. FINDINGS: Lower chest: Small bilateral pleural effusions with basilar atelectasis, greater on the left. Postoperative changes in the mediastinum. Small esophageal hiatal hernia. Hepatobiliary: No focal liver abnormality is seen. No gallstones, gallbladder wall thickening, or biliary dilatation. Pancreas: Mild infiltration in the fat around the pancreas consistent with mild acute pancreatitis. No pancreatic ductal dilatation. No loculated fluid collections. Spleen: Normal in size without focal abnormality. Adrenals/Urinary Tract: No adrenal gland nodules. Prominent vascular calcifications demonstrated in the renal sinus arteries bilaterally. No hydronephrosis or hydroureter. No renal, ureteral, or bladder stones. Bladder is decompressed without obvious bladder wall thickening. Prominent para renal fat bilaterally. Stomach/Bowel: Stomach is unremarkable. Small bowel are decompressed but there appears to be wall thickening in the duodenum suggesting duodenitis. Scattered stool throughout the colon without distention or apparent wall thickening. Appendix is not identified. Vascular/Lymphatic: Aortic atherosclerosis. No enlarged abdominal or  pelvic lymph nodes. Extensive vascular calcifications throughout the abdomen and pelvis. Reproductive: Prostate gland is enlarged, measuring 4.6 cm diameter. Other: No abdominal wall hernia or abnormality. No abdominopelvic ascites. Musculoskeletal: Postoperative and degenerative changes in the lumbar spine. No destructive bone lesions. IMPRESSION: Small bilateral pleural effusions with basilar atelectasis, greater on the left. Infiltration in the fat around the pancreas suggesting acute pancreatitis. Duodenal wall thickening suggesting duodenitis. No evidence of bowel obstruction. No renal or ureteral stone or obstruction. Diffuse vascular calcifications. Electronically Signed   By: Lucienne Capers M.D.   On: 03/05/2016 00:15   US Abdomen Limited Ruq  Result Date: 03/05/2016 CLINICAL DATA:  75 y/o M; right upper quadrant pain for 1 day with transaminitis. EXAM: US ABDOMEN LIMITED - RIGHT UPPER QUADRANT COMPARISON:  02/03/2016 CT of the abdomen and pelvis. FINDINGS: Gallbladder: No gallstones or wall thickening visualized. No sonographic Murphy sign noted by sonographer. Echogenic non shadowing material within the dependent gallbladder is compatible with sludge or tiny stones. Common bile duct: Diameter: 2.4 mm Liver: No focal lesion identified. Within normal limits in parenchymal echogenicity. Trace right pleural effusion. IMPRESSION: Gallbladder sludge or tiny stones. No secondary signs of cholecystitis. No common bile duct dilatation. Trace right pleural effusion. Electronically Signed   By: Kristine Garbe M.D.   On: 03/05/2016 02:12   Scheduled Meds: . amLODipine  10 mg Oral Daily  . calcium-vitamin D  1 tablet Oral Daily  . escitalopram  10 mg Oral Daily  . gabapentin  300 mg Oral BID  . insulin aspart  0-5 Units Subcutaneous QHS  . insulin aspart  0-9 Units Subcutaneous TID WC  . multivitamin with minerals  1 tablet Oral Daily  . pantoprazole (PROTONIX) IV  40 mg Intravenous BID  .  senna-docusate  2 tablet Oral BID  . simvastatin  40 mg Oral Daily  . Warfarin - Pharmacist Dosing Inpatient   Does not apply q1800   Continuous Infusions:  Principal Problem:   Acute pancreatitis Active Problems:   Atrial fibrillation (HCC)   GERD (gastroesophageal reflux disease)   CKD (chronic kidney disease), stage III   CAD (coronary artery disease)   Normocytic anemia   LFT elevation   Diabetes mellitus type II, non insulin dependent (HCC)   Duodenitis   AKI (acute kidney injury) (HCC)   Elevated LFTs   Renal insufficiency   RUQ pain  Time spent:  Irwin Brakeman, MD, FAAFP [facility] Pager [phone]  If 7PM-7AM, please contact night-coverage [email or web address] Password [email or web address] 03/06/2016, 2:33 PM    LOS: 1 day

## 2016-03-06 NOTE — Progress Notes (Signed)
ANTICOAGULATION CONSULT NOTE  Pharmacy Consult for warfarin Indication: atrial fibrillation  Allergies  Allergen Reactions  . Codeine Nausea Only   Patient Measurements: Height: 5\' 5"  (165.1 cm) Weight: 193 lb 2 oz (87.6 kg) IBW/kg (Calculated) : 61.5  Vital Signs: Temp: 99 F (37.2 C) (11/12 0557) Temp Source: Oral (11/12 0557) BP: 135/61 (11/12 0557) Pulse Rate: 64 (11/12 0557)  Labs:  Recent Labs  03/04/16 2232 03/05/16 0538 03/06/16 0352  HGB 10.9* 9.7*  9.6* 9.5*  HCT 32.2* 28.8*  28.1* 27.6*  PLT 240 211 215  LABPROT 20.1*  --  29.8*  INR 1.69  --  2.76  CREATININE 1.90* 1.63* 1.38*   Estimated Creatinine Clearance: 47.8 mL/min (by C-G formula based on SCr of 1.38 mg/dL (H)).  Medical History: Past Medical History:  Diagnosis Date  . A-fib (HCC)   . CAD (coronary artery disease)   . CKD (chronic kidney disease) stage 2, GFR 60-89 ml/min   . GERD (gastroesophageal reflux disease)   . HTN (hypertension)   . Hyperlipidemia   . 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 amLODipine  10 mg Oral Daily  . calcium-vitamin D  1 tablet Oral Daily  . escitalopram  10 mg Oral Daily  . gabapentin  300 mg Oral BID  . insulin aspart  0-5 Units Subcutaneous QHS  . insulin aspart  0-9 Units Subcutaneous TID WC  . multivitamin with minerals  1 tablet Oral Daily  . pantoprazole (PROTONIX) IV  40 mg Intravenous BID  . senna-docusate  2 tablet Oral BID  . simvastatin  40 mg Oral Daily  . warfarin  5 mg Oral q1800  . Warfarin - Pharmacist Dosing Inpatient   Does not apply q1800   Assessment:  [age] yoM to ED 11/10 with RUQ and flank pain. Prev ED admit [facility] for pancreatitis not associated with ETOH use or gallstones (discharged 11/9)  Chronic Warf 5mg  daily for Afib--LD 11/9 at 2200  INR 1.69 on admit  Today, 03/06/2016 - INR 2.76 - H/H sl decreased, Plt wnl - Tolerating CL diet  Goal of Therapy:  INR 2-3 Monitor  platelets by anticoagulation protocol: Yes   Plan:   Hold Warfarin today  Daily Protime/INR  Otho BellowsGreen, Natally Ribera L PharmD Pager [phone] 03/06/2016, 7:20 AM

## 2016-03-07 ENCOUNTER — Encounter: Payer: Self-pay | Admitting: Internal Medicine

## 2016-03-07 ENCOUNTER — Encounter (HOSPITAL_COMMUNITY): Payer: Self-pay | Admitting: Family Medicine

## 2016-03-07 DIAGNOSIS — K8020 Calculus of gallbladder without cholecystitis without obstruction: Secondary | ICD-10-CM

## 2016-03-07 DIAGNOSIS — K828 Other specified diseases of gallbladder: Secondary | ICD-10-CM

## 2016-03-07 DIAGNOSIS — K219 Gastro-esophageal reflux disease without esophagitis: Secondary | ICD-10-CM

## 2016-03-07 DIAGNOSIS — N289 Disorder of kidney and ureter, unspecified: Secondary | ICD-10-CM

## 2016-03-07 DIAGNOSIS — R1011 Right upper quadrant pain: Secondary | ICD-10-CM

## 2016-03-07 DIAGNOSIS — D649 Anemia, unspecified: Secondary | ICD-10-CM

## 2016-03-07 LAB — CBC WITH DIFFERENTIAL/PLATELET
Basophils Absolute: 0 10*3/uL (ref 0.0–0.1)
Basophils Relative: 0 %
EOS PCT: 4 %
Eosinophils Absolute: 0.3 10*3/uL (ref 0.0–0.7)
HEMATOCRIT: 28.1 % — AB (ref 39.0–52.0)
Hemoglobin: 9.7 g/dL — ABNORMAL LOW (ref 13.0–17.0)
LYMPHS ABS: 1.4 10*3/uL (ref 0.7–4.0)
LYMPHS PCT: 17 %
MCH: 29.8 pg (ref 26.0–34.0)
MCHC: 34.5 g/dL (ref 30.0–36.0)
MCV: 86.2 fL (ref 78.0–100.0)
MONO ABS: 0.9 10*3/uL (ref 0.1–1.0)
MONOS PCT: 11 %
Neutro Abs: 5.7 10*3/uL (ref 1.7–7.7)
Neutrophils Relative %: 68 %
PLATELETS: 259 10*3/uL (ref 150–400)
RBC: 3.26 MIL/uL — ABNORMAL LOW (ref 4.22–5.81)
RDW: 13.6 % (ref 11.5–15.5)
WBC: 8.2 10*3/uL (ref 4.0–10.5)

## 2016-03-07 LAB — COMPREHENSIVE METABOLIC PANEL
ALBUMIN: 3.1 g/dL — AB (ref 3.5–5.0)
ALT: 73 U/L — ABNORMAL HIGH (ref 17–63)
AST: 42 U/L — AB (ref 15–41)
Alkaline Phosphatase: 187 U/L — ABNORMAL HIGH (ref 38–126)
Anion gap: 7 (ref 5–15)
BILIRUBIN TOTAL: 1.2 mg/dL (ref 0.3–1.2)
BUN: 14 mg/dL (ref 6–20)
CHLORIDE: 107 mmol/L (ref 101–111)
CO2: 22 mmol/L (ref 22–32)
Calcium: 8.6 mg/dL — ABNORMAL LOW (ref 8.9–10.3)
Creatinine, Ser: 1.22 mg/dL (ref 0.61–1.24)
GFR calc Af Amer: >60 mL/min (ref >60)
GFR calc non Af Amer: 57 mL/min — ABNORMAL LOW (ref >60)
GLUCOSE: 167 mg/dL — AB (ref 65–99)
POTASSIUM: 4.3 mmol/L (ref 3.5–5.1)
SODIUM: 136 mmol/L (ref 135–145)
TOTAL PROTEIN: 6.1 g/dL — AB (ref 6.5–8.1)

## 2016-03-07 LAB — PROTIME-INR
INR: 2.97
PROTHROMBIN TIME: 31.6 s — AB (ref 11.4–15.2)

## 2016-03-07 LAB — GLUCOSE, CAPILLARY
Glucose-Capillary: 137 mg/dL — ABNORMAL HIGH (ref 65–99)
Glucose-Capillary: 211 mg/dL — ABNORMAL HIGH (ref 65–99)
Glucose-Capillary: 195 mg/dL — ABNORMAL HIGH (ref 65–99)

## 2016-03-07 MED ORDER — FERROUS SULFATE 325 (65 FE) MG PO TABS: 325.0000 mg | ORAL_TABLET | Freq: Every day | ORAL | 0 refills | Status: DC

## 2016-03-07 MED ORDER — URSODIOL 250 MG PO TABS: 250.0000 mg | ORAL_TABLET | Freq: Two times a day (BID) | ORAL | 0 refills | Status: DC

## 2016-03-07 NOTE — Discharge Instructions (Signed)
Biliary Colic °Biliary colic is a pain in the upper abdomen. The pain: °· Is usually felt on the right side of the abdomen, but it may also be felt in the center of the abdomen, just below the breastbone (sternum). °· May spread back toward the right shoulder blade. °· May be steady or irregular. °· May be accompanied by nausea and vomiting. °Most of the time, the pain goes away in 1-5 hours. After the most intense pain passes, the abdomen may continue to ache mildly for about 24 hours. °Biliary colic is caused by a blockage in the bile duct. The bile duct is a pathway that carries bile--a liquid that helps to digest fats--from the gallbladder to the small intestine. Biliary colic usually occurs after eating, when the digestive system demands bile. The pain develops when muscle cells contract forcefully to try to move the blockage so that bile can get by. °HOME CARE INSTRUCTIONS °· Take medicines only as directed by your health care provider. °· Drink enough fluid to keep your urine clear or pale yellow. °· Avoid fatty, greasy, and fried foods. These kinds of foods increase your body's demand for bile. °· Avoid any foods that make your pain worse. °· Avoid overeating. °· Avoid having a large meal after fasting. °SEEK MEDICAL CARE IF: °· You develop a fever. °· Your pain gets worse. °· You vomit. °· You develop nausea that prevents you from eating and drinking. °SEEK IMMEDIATE MEDICAL CARE IF: °· You suddenly develop a fever and shaking chills. °· You develop a yellowish discoloration (jaundice) of: °¨ Skin. °¨ Whites of the eyes. °¨ Mucous membranes. °· You have continuous or severe pain that is not relieved with medicines. °· You have nausea and vomiting that is not relieved with medicines. °· You develop dizziness or you faint. °  °This information is not intended to replace advice given to you by your health care provider. Make sure you discuss any questions you have with your health care provider. °  °Document  Released: 09/12/2005 Document Revised: 08/26/2014 Document Reviewed: 01/21/2014 °Elsevier Interactive Patient Education ©2016 Else[employer] Inc. ° ° °Low-Fat Diet for Pancreatitis or Gallbladder Conditions °A low-fat diet can be helpful if you have pancreatitis or a gallbladder condition. With these conditions, your pancreas and gallbladder have trouble digesting fats. A healthy eating plan with less fat will help rest your pancreas and gallbladder and reduce your symptoms. °WHAT DO I NEED TO KNOW ABOUT THIS DIET? °· Eat a low-fat diet. °¨ Reduce your fat intake to less than 20-30% of your total daily calories. This is less than 50-60 g of fat per day. °¨ Remember that you need some fat in your diet. Ask your dietician what your daily goal should be. °¨ Choose nonfat and low-fat healthy foods. Look for the words "nonfat," "low fat," or "fat free." °¨ As a guide, look on the label and choose foods with less than 3 g of fat per serving. Eat only one serving. °· Avoid alcohol. °· Do not smoke. If you need help quitting, talk with your health care provider. °· Eat small frequent meals instead of three large heavy meals. °WHAT FOODS CAN I EAT? °Grains °Include healthy grains and starches such as potatoes, wheat bread, fiber-rich cereal, and brown rice. Choose whole grain options whenever possible. In adults, whole grains should account for 45-65% of your daily calories.  °Fruits and Vegetables °Eat plenty of fruits and vegetables. Fresh fruits and vegetables add fiber to your diet. °Meats and   Protein Sources Eat lean meat such as chicken and pork. Trim any fat off of meat before cooking it. Eggs, fish, and beans are other sources of protein. In adults, these foods should account for 10-35% of your daily calories. Dairy Choose low-fat milk and dairy options. Dairy includes fat and protein, as well as calcium.  Fats and Oils Limit high-fat foods such as fried foods, sweets, baked goods, sugary drinks.  Other Creamy  sauces and condiments, such as mayonnaise, can add extra fat. Think about whether or not you need to use them, or use smaller amounts or low fat options. WHAT FOODS ARE NOT RECOMMENDED?  High fat foods, such as:  [employer] Corporation[employer]aked goods.  Ice cream.  [place] toast.  Sweet rolls.  Pizza.  Cheese bread.  Foods covered with batter, butter, creamy sauces, or cheese.  Fried foods.  Sugary drinks and desserts.  Foods that cause gas or bloating   This information is not intended to replace advice given to you by your health care provider. Make sure you discuss any questions you have with your health care provider.   Document Released: 04/16/2013 Document Reviewed: 04/16/2013 Elsevier Interactive Patient Education [employer]2016 [employer]  Acute Pancreatitis Acute pancreatitis is a disease in which the pancreas becomes suddenly inflamed. The pancreas is a large gland located behind your stomach. The pancreas produces enzymes that help digest food. The pancreas also releases the hormones glucagon and insulin that help regulate blood sugar. Damage to the pancreas occurs when the digestive enzymes from the pancreas are activated and begin attacking the pancreas before being released into the intestine. Most acute attacks last a couple of days and can cause serious complications. Some people become dehydrated and develop low blood pressure. In severe cases, bleeding into the pancreas can lead to shock and can be life-threatening. The lungs, heart, and kidneys may fail. CAUSES  Pancreatitis can happen to anyone. In some cases, the cause is unknown. Most cases are caused by:  Alcohol abuse.  Gallstones. Other less common causes are:  Certain medicines.  Exposure to certain chemicals.  Infection.  Damage caused by an accident (trauma).  Abdominal surgery. SYMPTOMS   Pain in the upper abdomen that may radiate to the back.  Tenderness and swelling of the abdomen.  Nausea and vomiting. DIAGNOSIS    Your caregiver will perform a physical exam. Blood and stool tests may be done to confirm the diagnosis. Imaging tests may also be done, such as X-rays, CT scans, or an ultrasound of the abdomen. TREATMENT  Treatment usually requires a stay in the hospital. Treatment may include:  Pain medicine.  Fluid replacement through an intravenous line (IV).  Placing a tube in the stomach to remove stomach contents and control vomiting.  Not eating for 3 or 4 days. This gives your pancreas a rest, because enzymes are not being produced that can cause further damage.  Antibiotic medicines if your condition is caused by an infection.  Surgery of the pancreas or gallbladder. HOME CARE INSTRUCTIONS   Follow the diet advised by your caregiver. This may involve avoiding alcohol and decreasing the amount of fat in your diet.  Eat smaller, more frequent meals. This reduces the amount of digestive juices the pancreas produces.  Drink enough fluids to keep your urine clear or pale yellow.  Only take over-the-counter or prescription medicines as directed by your caregiver.  Avoid drinking alcohol if it caused your condition.  Do not smoke.  Get plenty of rest.  Check your  blood sugar at home as directed by your caregiver.  Keep all follow-up appointments as directed by your caregiver. SEEK MEDICAL CARE IF:   You do not recover as quickly as expected.  You develop new or worsening symptoms.  You have persistent pain, weakness, or nausea.  You recover and then have another episode of pain. SEEK IMMEDIATE MEDICAL CARE IF:   You are unable to eat or keep fluids down.  Your pain becomes severe.  You have a fever or persistent symptoms for more than 2 to 3 days.  You have a fever and your symptoms suddenly get worse.  Your skin or the white part of your eyes turn yellow (jaundice).  You develop vomiting.  You feel dizzy, or you faint.  Your blood sugar is high (over 300 mg/dL). MAKE  SURE YOU:   Understand these instructions.  Will watch your condition.  Will get help right away if you are not doing well or get worse.   This information is not intended to replace advice given to you by your health care provider. Make sure you discuss any questions you have with your health care provider.   Document Released: 04/11/2005 Document Revised: 10/11/2011 Document Reviewed: 07/21/2011 Elsevier Interactive Patient Education 2016 Else[employer] Inc.  Ursodeoxycholic Acid, Ursodiol capsules or tablets What is this medicine? URSODIOL (ER soe dye ol) helps dissolve gallstones in patients who cannot have or who do not need gallbladder surgery. This medicine is also useful for certain liver diseases of adults, children and infants. This medicine may be used for other purposes; ask your health care provider or pharmacist if you have questions. What should I tell my health care provider before I take this medicine? They need to know if you have any of these conditions: -blocked bile duct or fistula -pancreatitis -an unusual or allergic reaction to ursodiol, bile acids, other medicines, foods, dyes, or preservatives -pregnant or trying to get pregnant -breast-feeding How should I use this medicine? Take this medicine by mouth with a glass of water. Follow the directions on the prescription label. Take your doses at regular intervals. Do not take your medicine more often than directed. Do not stop taking except on the advice of your doctor or health care professional. Talk to your pediatrician regarding the use of this medicine in children. Special care may be needed. Overdosage: If you think you have taken too much of this medicine contact a poison control center or emergency room at once. NOTE: This medicine is only for you. Do not share this medicine with others. What if I miss a dose? If you miss a dose, take it as soon as you can. If it is almost time for your next dose, take only  that dose. Do not take double or extra doses. What may interact with this medicine? -antacids -cholestyramine -clofibrate, fenofibrate, or gemfibrozil -colesevelam -colestipol -male hormones, including estrogens or birth control pills This list may not describe all possible interactions. Give your health care provider a list of all the medicines, herbs, non-prescription drugs, or dietary supplements you use. Also tell them if you smoke, drink alcohol, or use illegal drugs. Some items may interact with your medicine. What should I watch for while using this medicine? Visit your doctor or health care professional for regular checks on your progress. It may take months of therapy to get the right response. Your doctor or health care professional will schedule tests to see if your gallstones are dissolving or if your liver problem is  improving. You may also need blood work done while you are taking this medicine to check that your liver is working properly. Report continued or worsened nausea, vomiting, or abdominal pain to your doctor. Antacids may interfere with the absorption of this medicine. Take this medicine at least 1 hour before or 2 hours after an antacid dose. What side effects may I notice from receiving this medicine? Side effects that you should report to your doctor or health care professional as soon as possible: -allergic reactions like skin rash, itching or hives, swelling of the face, lips, or tongue -severe stomach area pain, especially toward your right side Side effects that usually do not require medical attention (report to your doctor or health care professional if they continue or are bothersome): -constipation -gas -indigestion -nausea This list may not describe all possible side effects. Call your doctor for medical advice about side effects. You may report side effects to FDA at 1-800-FDA-1088. Where should I keep my medicine? Keep out of the reach of  children. Store at room temperature between 15 and 30 degrees C (59 and 86 degrees F). Keep container tightly closed. Throw away any unused medicine after the expiration date. NOTE: This sheet is a summary. It may not cover all possible information. If you have questions about this medicine, talk to your doctor, pharmacist, or health care provider.    2016, Elsevier/[employer]. (2011-11-07 10:26:07)

## 2016-03-07 NOTE — Discharge Summary (Signed)
Physician Discharge Summary  Johnathan Chavez YWV:[id number] DOB: [birth year] DOA: 03/04/2016  PCP: Kendrick Ranch, MD GI:  Dr. Burnard Bunting  - [facility]  Admit date: 03/04/2016 Discharge date: 03/07/2016  Admitted From: Home  Disposition:  Home   Recommendations for Outpatient Follow-up:  1. Follow up with GI in 1-2 weeks, Follow up with PCP in 1-2 weeks 2. Please monitor PT/INR on follow up   Discharge Condition: Stable  CODE STATUS: Full  Diet recommendation: Heart Healthy / Carb Modified  Brief/Interim Summary: Hospital course: Johnathan Chavez a 75 y.o.malewith medical history significant for type 2 diabetes mellitus, chronic kidney disease stage II, atrial fibrillation on Coumadin, and GERD who presents to the emergency department with pain in the right upper quadrant abdomen and right flank. Patient had just been admitted to a hospital in [place] where he was managed for acute pancreatitis and discharged on 03/03/2016 with normal LFTs and a serum creatinine 1.23. Etiology of the pancreatitis was not identified as the patient denied alcohol use and no gallstones were identified. It is not clear if triglycerides were evaluated. The patient was experiencing pain in the midabdomen and epigastrium that had resolved by the time of his hospital discharge, but he had developed pain in the right flank and right upper quadrant around that same time that he was being discharged. The pain has persisted with no alleviating or exacerbating factors identified, and he was evaluated for it at an urgent care today. He was told that there were some new abnormalities on his blood work and he was directed to the emergency department for further evaluation. The patient denies any recent fevers or chills and denies vomiting or diarrhea. He has continued to maintain an appetite and denies any worsening in his symptoms with meals. He denies any recent new medications, alcohol use, or  trauma.  Assessment & Plan:   1. Acute pancreatitis (second bout and second hospitalization) - The patient has pain and radiographic evidence for pancreatitis but lipase is normal and there is some diagnostic uncertainty  - With newly elevated LFTs, concern for obstructing stone - Pt denies alcohol use; RUQ [place] ordered to look for stones, none seen on CT; US shows gallbladder sludge and small stones;  Lipids well controlled.  MRCP normal.    - Treated with IVF hydration and clears, to low fat diet.  Discharge home on low fat diet.    2. Elevated LFTs  - There is elevation in alk phos, transaminases, and total bilirubin that appears new from 03/03/16  - Hepatobiliary system appears unremarkable on CT; RUQ [place] as noted above.   - I spoke with GI (Dr. Ardis Hughs), gallbladder sludge and small stones can be causing the recurrent pancreatitis and elevated levels, likely is going to need cholecystectomy, asked to get MRCP and if negative will need general surgery evaluation for cholecystectomy.   General surgery saw patient and recommended medical management of cholestasis prior to consideration of cholecystectomy.  Started on Ursodiol 250 mg BID with instructions to follow up with his private GI doctor next week for reassessment.  I also spoke with [facility] before discharge and they recommended going with Urso 250 BID with close outpatient GI follow up but patient did not need inpatient GI consultation.    3. AKI superimposed on CKD stage II  - SCr is 1.9 on admission, up from 1.2 on on 03/03/16  - Abd CT without hydronephrosis or hydroureter; no radiopaque stones seen  - He received a  liter of NS in ED and IVF hydration will be continued  - Hold lisinopril, can resume at discharge as creatinine normalized prior to discharge.    4. GERD, duodenitis  - No EGD report on file  - There is evidence of duodenitis on CT  - Continue PPI at discharge   5. Atrial fibrillation  - In a regular rhythm  on admission  - CHADS-VASc at least 4 (age, CAD, HTN, DM)  - Continue warfarin with pharmacy to assist with dosing in hospital - Hold warfarin for 1 day, resume 11/14.  Pt told and verbalized understanding.    6. CAD - No anginal complaints  - Continue Zocor, resume lisinopril at discharge   7. Type II DM  - A1c 6.4% - Managed with glipizide and metformin at home; these will be held in the hospital and resumed at discharge  8. Normocytic anemia  - Hgb 10.9 on admission with no prior for comparison  - No s/s of bleeding, no pallor , Anemia panel reveals low iron levels, iron supplement recommended at discharge  DVT prophylaxis:warfarin (therapeutic) Code Status:Full  Family Communication:Wife updated  Disposition Plan:Admitted to med-surg Consults called:Telephone [facility]  Discharge Diagnoses:  Principal Problem:   Acute pancreatitis Active Problems:   Atrial fibrillation (HCC)   GERD (gastroesophageal reflux disease)   CKD (chronic kidney disease), stage III   CAD (coronary artery disease)   Normocytic anemia   LFT elevation   Diabetes mellitus type II, non insulin dependent (HCC)   Duodenitis   AKI (acute kidney injury) (HCC)   Elevated LFTs   Renal insufficiency   RUQ pain   Gallbladder sludge   Small Gallstones  Discharge Instructions  Discharge Instructions    Increase activity slowly    Complete by:  As directed        Medication List    STOP taking these medications   hydrocortisone 2.5 % rectal cream Commonly known as:  ANUSOL-HC   pantoprazole 40 MG tablet Commonly known as:  PROTONIX     TAKE these medications   amLODipine 10 MG tablet Commonly known as:  NORVASC Take 10 mg by mouth daily.   escitalopram 10 MG tablet Commonly known as:  LEXAPRO Take 10 mg by mouth daily.   ferrous sulfate 325 (65 FE) MG tablet Take 1 tablet (325 mg total) by mouth daily with breakfast.   Fish Oil 1200 MG Caps Take 1,200  mg by mouth daily.   FLUZONE HIGH-DOSE 0.5 ML Susy Generic drug:  Influenza Vac Split High-Dose TO BE ADMINISTERED BY PHARMACIST FOR IMMUNIZATION   gabapentin 300 MG capsule Commonly known as:  NEURONTIN Take 300 mg by mouth 2 (two) times daily.   Ginkgo Biloba 60 MG Tabs Take 60 mg by mouth daily.   glipiZIDE 10 MG tablet Commonly known as:  GLUCOTROL Take 10 mg by mouth 3 (three) times daily.   GLUCOSAMINE 1500 COMPLEX PO Take 1,500 mg by mouth daily.   GOODSENSE ASPIRIN 325 MG tablet Generic drug:  aspirin Take 325 mg by mouth daily.   LINZESS 72 MCG capsule Generic drug:  linaclotide Take 72 mcg by mouth daily as needed (hard stool).   lisinopril 20 MG tablet Commonly known as:  PRINIVIL,ZESTRIL Take 20 mg by mouth daily.   metFORMIN 1000 MG tablet Commonly known as:  GLUCOPHAGE Take 1,000 mg by mouth 2 (two) times daily.   multivitamin with minerals Tabs tablet Take 1 tablet by mouth daily.  omeprazole 20 MG capsule Commonly known as:  PRILOSEC Take 20 mg by mouth daily.   PREVNAR 13 Susp injection Generic drug:  pneumococcal 13-valent conjugate vaccine TO BE ADMINISTERED BY PHARMACIST FOR IMMUNIZATION   simvastatin 40 MG tablet Commonly known as:  ZOCOR Take 40 mg by mouth daily.   SM CALCIUM 500/VITAMIN D3 500-400 MG-UNIT tablet Generic drug:  calcium-vitamin D Take 1 tablet by mouth daily.   traMADol 50 MG tablet Commonly known as:  ULTRAM Take 50 mg by mouth 2 (two) times daily as needed for moderate pain.   ursodiol 250 MG tablet Commonly known as:  ACTIGALL Take 1 tablet (250 mg total) by mouth 2 (two) times daily.   VITAMIN B 12 PO Take 1,000 mg by mouth daily.   vitamin C 500 MG tablet Commonly known as:  ASCORBIC ACID Take 500 mg by mouth daily.   warfarin 5 MG tablet Commonly known as:  COUMADIN Take 5 mg by mouth daily.      Follow-up Information    Manus Rudd, MD. Schedule an appointment as soon as possible for a visit  in 1 week(s).   Specialty:  Gastroenterology Why:  Hospital Follow Up Contact information: [address] [place] [address] [phone]        Kendrick Ranch, MD. Schedule an appointment as soon as possible for a visit in 2 week(s).   Specialty:  Internal Medicine Why:  Hospital Follow Up  Contact information: [address] [place] [address] [phone]          Allergies  Allergen Reactions  . Codeine Nausea Only    Procedures/Studies: Ct Abdomen Pelvis Wo Contrast  Result Date: 03/05/2016 CLINICAL DATA:  Right abdominal and right flank pain. Elevated liver function studies. Recent episode of pancreatitis. Patient was seen at urgent care in [facility] and discharge from [facility] on 03/03/2016. EXAM: CT ABDOMEN AND PELVIS WITHOUT CONTRAST TECHNIQUE: Multidetector CT imaging of the abdomen and pelvis was performed following the standard protocol without IV contrast. COMPARISON:  None. FINDINGS: Lower chest: Small bilateral pleural effusions with basilar atelectasis, greater on the left. Postoperative changes in the mediastinum. Small esophageal hiatal hernia. Hepatobiliary: No focal liver abnormality is seen. No gallstones, gallbladder wall thickening, or biliary dilatation. Pancreas: Mild infiltration in the fat around the pancreas consistent with mild acute pancreatitis. No pancreatic ductal dilatation. No loculated fluid collections. Spleen: Normal in size without focal abnormality. Adrenals/Urinary Tract: No adrenal gland nodules. Prominent vascular calcifications demonstrated in the renal sinus arteries bilaterally. No hydronephrosis or hydroureter. No renal, ureteral, or bladder stones. Bladder is decompressed without obvious bladder wall thickening. Prominent para renal fat bilaterally. Stomach/Bowel: Stomach is unremarkable. Small bowel are decompressed but there appears to be wall thickening in the duodenum suggesting duodenitis.  Scattered stool throughout the colon without distention or apparent wall thickening. Appendix is not identified. Vascular/Lymphatic: Aortic atherosclerosis. No enlarged abdominal or pelvic lymph nodes. Extensive vascular calcifications throughout the abdomen and pelvis. Reproductive: Prostate gland is enlarged, measuring 4.6 cm diameter. Other: No abdominal wall hernia or abnormality. No abdominopelvic ascites. Musculoskeletal: Postoperative and degenerative changes in the lumbar spine. No destructive bone lesions. IMPRESSION: Small bilateral pleural effusions with basilar atelectasis, greater on the left. Infiltration in the fat around the pancreas suggesting acute pancreatitis. Duodenal wall thickening suggesting duodenitis. No evidence of bowel obstruction. No renal or ureteral stone or obstruction. Diffuse vascular calcifications. Electronically Signed   By: Lucienne Capers M.D.   On: 03/05/2016 00:15  Mr Abdomen Mrcp Moise Boring Contast  Result Date: 03/06/2016 CLINICAL DATA:  Acute pancreatitis. EXAM: MRI ABDOMEN WITHOUT AND WITH CONTRAST (INCLUDING MRCP) TECHNIQUE: Multiplanar multisequence MR imaging of the abdomen was performed both before and after the administration of intravenous contrast. Heavily T2-weighted images of the biliary and pancreatic ducts were obtained, and three-dimensional MRCP images were rendered by post processing. CONTRAST:  52m MULTIHANCE GADOBENATE DIMEGLUMINE 529 MG/ML IV SOLN COMPARISON:  Ultrasound from 03/05/2016 and CT abdomen pelvis from 03/04/2016. FINDINGS: Lower chest: Small to moderate bilateral pleural effusions identified. No pericardial effusion. Hepatobiliary: No focal liver abnormality identified. The gallbladder appears normal. No filling defects identified within the gallbladder lumen. No common bile duct or intrahepatic bile duct dilatation. There is no evidence for choledocholithiasis. Pancreas: Mild edema with surrounding fat stranding. No pancreatic duct  dilatation and no fluid collections identified to suggest pseudocyst formation. No findings identified to suggest pancreatic necrosis secondary to pancreatitis. Spleen:  Within normal limits in size and appearance. Adrenals/Urinary Tract: No masses identified. No evidence of hydronephrosis. Similar appearance of chronic bilateral perinephric fat stranding. Stomach/Bowel: Stomach appears normal. There is wall thickening involving the second portion of the duodenal C-loop, image 31 of series 12. No pathologic dilatation of the small or large bowel. Vascular/Lymphatic: No pathologically enlarged lymph nodes identified. No abdominal aortic aneurysm demonstrated. Other:  None. Musculoskeletal: No suspicious bone lesions identified. IMPRESSION: 1. Mild pancreatic edema compatible with the clinical history of pancreatitis. No complications of pancreatitis identified. No significant pancreatic duct dilatation, pancreatic necrosis or pseudocyst. 2. Wall thickening involving the descending portion of the duodenum which may reflect secondary inflammatory changes versus primary duodenitis. 3. Normal appearance of the gallbladder. No gallstones or evidence of choledocholithiasis. No biliary dilatation. 4. Electronically Signed   By: TKerby MoorsM.D.   On: 03/06/2016 15:11   [place]dom[place] Limited Ruq  Result Date: 03/05/2016 CLINICAL DATA:  75y/o M; right upper quadrant pain for 1 day with transaminitis. EXAM: [place]DOM[place] LIMITED - RIGHT UPPER QUADRANT COMPARISON:  02/03/2016 CT of the abdomen and pelvis. FINDINGS: Gallbladder: No gallstones or wall thickening visualized. No sonographic Murphy sign noted by sonographer. Echogenic non shadowing material within the dependent gallbladder is compatible with sludge or tiny stones. Common bile duct: Diameter: 2.4 mm Liver: No focal lesion identified. Within normal limits in parenchymal echogenicity. Trace right pleural effusion. IMPRESSION: Gallbladder sludge or tiny stones. No  secondary signs of cholecystitis. No common bile duct dilatation. Trace right pleural effusion. Electronically Signed   By: LKristine GarbeM.D.   On: 03/05/2016 02:12   (Echo, Carotid, EGD, Colonoscopy, ERCP)    Subjective: Pt tolerating diet, no pain, no emesis or nausea, wants to go home  Discharge Exam: Vitals:   03/06/16 2144 03/07/16 0555  BP: (!) 146/94 122/77  Pulse: 70 72  Resp: 18 16  Temp: 100 F (37.8 C) 98.1 F (36.7 C)   Vitals:   03/06/16 0557 03/06/16 1511 03/06/16 2144 03/07/16 0555  BP: 135/61 (!) 141/69 (!) 146/94 122/77  Pulse: 64 66 70 72  Resp: 16 16 18 16   Temp: 99 F (37.2 C) 99 F (37.2 C) 100 F (37.8 C) 98.1 F (36.7 C)  TempSrc: Oral Oral Oral Oral  SpO2: 95% 97% 94% 94%  Weight:      Height:        General: Pt is alert, awake, not in acute distress Cardiovascular: RRR, S1/S2 +, no rubs, no gallops Respiratory: CTA bilaterally, no wheezing, no rhonchi  Abdominal: Soft, NT, ND, bowel sounds + Extremities: no edema, no cyanosis  The results of significant diagnostics from this hospitalization (including imaging, microbiology, ancillary and laboratory) are listed below for reference.     Microbiology: Recent Results (from the past 240 hour(s))  Urine culture     Status: None   Collection Time: 03/04/16 11:45 PM  Result Value Ref Range Status   Specimen Description URINE, CLEAN CATCH  Final   Special Requests NONE  Final   Culture NO GROWTH Performed at [facility]   Final   Report Status 03/06/2016 FINAL  Final     Labs: BNP (last 3 results) No results for input(s): BNP in the last 8760 hours. Basic Metabolic Panel:  Recent Labs Lab 03/04/16 2232 03/05/16 0538 03/06/16 0352 03/07/16 0351  NA 135 137 136 136  K 4.0 3.3* 4.1 4.3  CL 105 108 110 107  CO2 22 22 21* 22  GLUCOSE 129* 91 133* 167*  BUN 32* 30* 21* 14  CREATININE 1.90* 1.63* 1.38* 1.22  CALCIUM 8.1* 7.7* 7.9* 8.6*   Liver Function  Tests:  Recent Labs Lab 03/04/16 2232 03/05/16 0538 03/06/16 0352 03/07/16 0351  AST 149* 111* 85* 42*  ALT 91* 89* 93* 73*  ALKPHOS 181* 159* 176* 187*  BILITOT 1.6* 0.9 1.5* 1.2  PROT 6.8 5.6* 5.9* 6.1*  ALBUMIN 3.5 2.8* 2.7* 3.1*    Recent Labs Lab 03/04/16 2232 03/06/16 0352  LIPASE 28 20    Recent Labs Lab 03/05/16 0231  AMMONIA 10   CBC:  Recent Labs Lab 03/04/16 2232 03/05/16 0538 03/06/16 0352 03/07/16 0351  WBC 12.1* 9.3 8.3 8.2  NEUTROABS 9.5* 6.9 6.2 5.7  HGB 10.9* 9.7*  9.6* 9.5* 9.7*  HCT 32.2* 28.8*  28.1* 27.6* 28.1*  MCV 89.4 90.0 86.0 86.2  PLT 240 211 215 259   Cardiac Enzymes: No results for input(s): CKTOTAL, CKMB, CKMBINDEX, TROPONINI in the last 168 hours. BNP: Invalid input(s): POCBNP CBG:  Recent Labs Lab 03/06/16 1142 03/06/16 1711 03/06/16 2114 03/07/16 0752 03/07/16 1139  GLUCAP 151* 199* 156* 137* 211*   D-Dimer No results for input(s): DDIMER in the last 72 hours. Hgb A1c  Recent Labs  03/04/16 2232  HGBA1C 6.4*   Lipid Profile  Recent Labs  03/06/16 0352  CHOL 116  HDL 25*  LDLCALC 64  TRIG 137  CHOLHDL 4.6   Thyroid function studies No results for input(s): TSH, T4TOTAL, T3FREE, THYROIDAB in the last 72 hours.  Invalid input(s): FREET3 Anemia work up  Recent Labs  03/05/16 0538  VITAMINB12 1,670*  FOLATE 33.6  FERRITIN 215  TIBC 230*  IRON 15*  RETICCTPCT 2.0   Urinalysis    Component Value Date/Time   COLORURINE ORANGE (A) 03/04/2016 2342   APPEARANCEUR CLOUDY (A) 03/04/2016 2342   LABSPEC 1.028 03/04/2016 2342   PHURINE 5.5 03/04/2016 2342   GLUCOSEU NEGATIVE 03/04/2016 2342   HGBUR NEGATIVE 03/04/2016 2342   BILIRUBINUR MODERATE (A) 03/04/2016 2342   [place] NEGATIVE 03/04/2016 2342   PROTEINUR 30 (A) 03/04/2016 2342   NITRITE POSITIVE (A) 03/04/2016 2342   LEUKOCYTESUR SMALL (A) 03/04/2016 2342   Sepsis Labs Invalid input(s): PROCALCITONIN,  WBC,   LACTICIDVEN Microbiology Recent Results (from the past 240 hour(s))  Urine culture     Status: None   Collection Time: 03/04/16 11:45 PM  Result Value Ref Range Status   Specimen Description URINE, CLEAN CATCH  Final   Special Requests NONE  Final  Culture NO GROWTH Performed at [facility]   Final   Report Status 03/06/2016 FINAL  Final   Time coordinating discharge: 32 minutes  SIGNED:  Irwin Brakeman, MD  [facility] 03/07/2016, 1:03 PM Pager   If 7PM-7AM, please contact night-coverage [email or web address] Password [email or web address]

## 2016-03-07 NOTE — Progress Notes (Signed)
Pt discharged home with spouse in stable condition. Discharge instructions and scripts given. Pt and spouse verbalized understanding. No immediate questions or concerns at this time.  

## 2016-03-07 NOTE — Consult Note (Signed)
[facility] Surgery Consult Note  Johnathan Chavez [birth year]  [id number].    Requesting MD: Wynetta Emery, MD Chief Complaint/Reason for Consult: acute pancreatitis with gallbladder sludge  HPI:  75 y/o male with DM2, CKD, HTN, HLD, CAD, a.fib on coumadin, and GERD admitted 03/04/16 with RUQ pain. He was recently discharged from [facility] 11/9 where he was treated for pancreatitis of unknown etiology. At time of discharge his LFT's were WNL and his epigastric pain had resolved. Patient states that around the time of his discharge he developed sharp, non-radiating, RUQ/right flank abdominal pain and went to an urgent care for evaluation. He was subsequently sent to the ED when his LFT's were found to be elevated.  CT abd/ pelvis performed in ED significant for Infiltration in the fat around the pancreas suggesting acute pancreatitis as well as duodenal wall thickening suggesting duodenitis and the patient was admitted for further workup and management. RUQ U/S significant for gallbladder sludge and possible tiny stones without signs of cholecystitis. MRCP performed yesterday revealed normal gallbladder without stones or choledocholithiasis, inflammation of pancreas and duodenum again identified. Patient denies alcohol abuse. Triglycerides are pending. General surgery has been asked to see the patient regarding possible laparoscopic cholecystectomy in patient with transaminitis and an unknown cause of pancreatitis.   Patient denies fever, chills, nausea, vomiting, CP, SOB, palpitations, or urinary symptoms. He is having loose, non-bloody bowel movements. Past abdominal surgeries include an appendectomy and left inguinal hernia repair.   ROS: All systems reviewed and otherwise negative except for as above  Family History  Problem Relation Age of Onset  . Diabetes Mother   . Colon cancer Father     ?colon cancer, age early 11    Past Medical History:  Diagnosis Date  .  A-fib ([place])   . CAD (coronary artery disease)   . CKD (chronic kidney disease) stage 2, GFR 60-89 ml/min   . GERD (gastroesophageal reflux disease)   . HTN (hypertension)   . Hyperlipidemia   . 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 APPENDECTOMY    . BACK SURGERY     X2  . CERVICAL DISC SURGERY    . COLONOSCOPY  04/2014   [facility]: moderate sized internal hemorrhoids, 2 tubular adenomas removed from colon. diverticulosis  . CORONARY ARTERY BYPASS GRAFT  2001  . ESOPHAGOGASTRODUODENOSCOPY  01/2015   Dr. Posey Pronto: gastritis  . INGUINAL HERNIA REPAIR Left   . SHOULDER ARTHROSCOPY Right   . SIGMOIDOSCOPY  01/2015   Dr. Posey Pronto: internal hemorrhoids  . TONSILLECTOMY      Social History:  reports that he has never smoked. He has never used smokeless tobacco. He reports that he does not drink alcohol or use drugs.  Allergies:  Allergies  Allergen Reactions  . Codeine Nausea Only   Hepatic Function Latest Ref Rng & Units 03/07/2016 03/06/2016 03/05/2016  Total Protein 6.5 - 8.1 g/dL 6.1(L) 5.9(L) 5.6(L)  Albumin 3.5 - 5.0 g/dL 3.1(L) 2.7(L) 2.8(L)  AST 15 - 41 U/L 42(H) 85(H) 111(H)  ALT 17 - 63 U/L 73(H) 93(H) 89(H)  Alk Phosphatase 38 - 126 U/L 187(H) 176(H) 159(H)  Total Bilirubin 0.3 - 1.2 mg/dL 1.2 1.5(H) 0.9   CBC Latest Ref Rng & Units 03/07/2016 03/06/2016 03/05/2016  WBC 4.0 - 10.5 K/uL 8.2 8.3 9.3  Hemoglobin 13.0 - 17.0 g/dL 9.7(L) 9.5(L) 9.7(L)  Hematocrit 39.0 - 52.0 %  28.1(L) 27.6(L) 28.8(L)  Platelets 150 - 400 K/uL 259 215 211     Mr Abdomen Mrcp W Wo Contast  Result Date: 03/06/2016 CLINICAL DATA:  Acute pancreatitis. EXAM: MRI ABDOMEN WITHOUT AND WITH CONTRAST (INCLUDING MRCP) TECHNIQUE: Multiplanar multisequence MR imaging of the abdomen was performed both before and after the administration of intravenous contrast. Heavily T2-weighted images of the biliary and pancreatic  ducts were obtained, and three-dimensional MRCP images were rendered by post processing. CONTRAST:  77m MULTIHANCE GADOBENATE DIMEGLUMINE 529 MG/ML IV SOLN COMPARISON:  Ultrasound from 03/05/2016 and CT abdomen pelvis from 03/04/2016. FINDINGS: Lower chest: Small to moderate bilateral pleural effusions identified. No pericardial effusion. Hepatobiliary: No focal liver abnormality identified. The gallbladder appears normal. No filling defects identified within the gallbladder lumen. No common bile duct or intrahepatic bile duct dilatation. There is no evidence for choledocholithiasis. Pancreas: Mild edema with surrounding fat stranding. No pancreatic duct dilatation and no fluid collections identified to suggest pseudocyst formation. No findings identified to suggest pancreatic necrosis secondary to pancreatitis. Spleen:  Within normal limits in size and appearance. Adrenals/Urinary Tract: No masses identified. No evidence of hydronephrosis. Similar appearance of chronic bilateral perinephric fat stranding. Stomach/Bowel: Stomach appears normal. There is wall thickening involving the second portion of the duodenal C-loop, image 31 of series 12. No pathologic dilatation of the small or large bowel. Vascular/Lymphatic: No pathologically enlarged lymph nodes identified. No abdominal aortic aneurysm demonstrated. Other:  None. Musculoskeletal: No suspicious bone lesions identified. IMPRESSION: 1. Mild pancreatic edema compatible with the clinical history of pancreatitis. No complications of pancreatitis identified. No significant pancreatic duct dilatation, pancreatic necrosis or pseudocyst. 2. Wall thickening involving the descending portion of the duodenum which may reflect secondary inflammatory changes versus primary duodenitis. 3. Normal appearance of the gallbladder. No gallstones or evidence of choledocholithiasis. No biliary dilatation. 4. Electronically Signed   By: TKerby MoorsM.D.   On: 03/06/2016 15:11    Assessment/Plan Transaminitis  RUQ pain  RUQ positive for sludge but no obvious stones  MRCP negative for choledocholithiasis or biliary dilatation   WBC WNL   Acute pancreatitis- unknown source; pancreatic inflammation on CT, lipase WNL; denies EtoH; TG pending. Acute on chronic CKD II GERD/duodenitis - protonix BID Atrial fibrillation - coumadin, last dose 11/11; INR 2.97   CAD DM type II Normocytic anemia  FEN: clear liquid diet  ID: none VTE: coumadin held, SCD's  Plan - mildly elevated LFT's with no signs of biliary obstruction or acute cholecystitis on imaging.  Would recommend medical treatment for cholestasis per GI before considering laparoscopic cholecystectomy. Will confirm with MD.     EJill Alexanders [facility] Center[facility] 03/07/2016, 9:40 AM Pager: [phone]: [phone]-Fri 7:00 am-4:30 pm Sat-Sun 7:00 am-11:30 am

## 2016-03-07 NOTE — Progress Notes (Signed)
ANTICOAGULATION CONSULT NOTE  Pharmacy Consult for warfarin Indication: atrial fibrillation  Allergies  Allergen Reactions  . Codeine Nausea Only   Patient Measurements: Height: 5\' 5"  (165.1 cm) Weight: 193 lb 2 oz (87.6 kg) IBW/kg (Calculated) : 61.5  Vital Signs: Temp: 98.1 F (36.7 C) (11/13 0555) Temp Source: Oral (11/13 0555) BP: 122/77 (11/13 0555) Pulse Rate: 72 (11/13 0555)  Labs:  Recent Labs  03/04/16 2232 03/05/16 0538 03/06/16 0352 03/07/16 0351  HGB 10.9* 9.7*  9.6* 9.5* 9.7*  HCT 32.2* 28.8*  28.1* 27.6* 28.1*  PLT 240 211 215 259  LABPROT 20.1*  --  29.8* 31.6*  INR 1.69  --  2.76 2.97  CREATININE 1.90* 1.63* 1.38* 1.22   Estimated Creatinine Clearance: 54 mL/min (by C-G formula based on SCr of 1.22 mg/dL).  Medical History: Past Medical History:  Diagnosis Date  . A-fib ([facility])   . CAD (coronary artery disease)   . CKD (chronic kidney disease) stage 2, GFR 60-89 ml/min   . GERD (gastroesophageal reflux disease)   . HTN (hypertension)   . Hyperlipidemia   . 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 amLODipine  10 mg Oral Daily  . calcium-vitamin D  1 tablet Oral Daily  . escitalopram  10 mg Oral Daily  . gabapentin  300 mg Oral BID  . insulin aspart  0-5 Units Subcutaneous QHS  . insulin aspart  0-9 Units Subcutaneous TID WC  . multivitamin with minerals  1 tablet Oral Daily  . pantoprazole (PROTONIX) IV  40 mg Intravenous BID  . senna-docusate  2 tablet Oral BID  . simvastatin  40 mg Oral Daily  . Warfarin - Pharmacist Dosing Inpatient   Does not apply q1800   Assessment:  [age] yoM to ED 11/10 with RUQ and flank pain. Prev ED admit [facility] for pancreatitis not associated with ETOH use or gallstones (discharged 11/9)  Chronic Warf 5mg  daily for Afib--LD 11/9 at 2200  INR 1.69 on admit  Today, 03/07/2016 - INR increased to 2.97 - H/H low, stable; Plt wnl - Tolerating CL diet - CrCl improved  to WNL  Goal of Therapy:  INR 2-3 Monitor platelets by anticoagulation protocol: Yes   Plan:   Hold Warfarin today  Daily Protime/INR  Arley Phenixllen Wei Newbrough RPh 03/07/2016, 12:12 PM Pager [phone]

## 2016-03-09 ENCOUNTER — Telehealth: Payer: Self-pay | Admitting: Internal Medicine

## 2016-03-09 NOTE — Telephone Encounter (Signed)
Pt has OV with [facility] on 11/29 and wants to speak with the nurse prior to his appt. He has questions about his recently hospitalization and his gallbladder. Please call [phone]

## 2016-03-10 NOTE — Telephone Encounter (Signed)
Per the patient's discharge instructions he needed to be seen by us with in 1-2 weeks.  I had a cancellation on LL's schedule for tomorrow so I moved the patient to her schedule to be seen at 1030

## 2016-03-11 ENCOUNTER — Ambulatory Visit (INDEPENDENT_AMBULATORY_CARE_PROVIDER_SITE_OTHER): Payer: Medicare Other | Admitting: Gastroenterology

## 2016-03-11 ENCOUNTER — Encounter: Payer: Self-pay | Admitting: Gastroenterology

## 2016-03-11 VITALS — BP 124/66 | HR 58 | Temp 98.3°F | Ht 65.0 in | Wt 178.2 lb

## 2016-03-11 DIAGNOSIS — K8020 Calculus of gallbladder without cholecystitis without obstruction: Secondary | ICD-10-CM

## 2016-03-11 DIAGNOSIS — K851 Biliary acute pancreatitis without necrosis or infection: Secondary | ICD-10-CM

## 2016-03-11 LAB — CBC WITH DIFFERENTIAL/PLATELET
BASOS PCT: 0 %
Basophils Absolute: 0 cells/uL (ref 0–200)
EOS ABS: 190 {cells}/uL (ref 15–500)
Eosinophils Relative: 2 %
HEMATOCRIT: 34.9 % — AB (ref 38.5–50.0)
Hemoglobin: 11.3 g/dL — ABNORMAL LOW (ref 13.2–17.1)
LYMPHS PCT: 20 %
Lymphs Abs: 1900 cells/uL (ref 850–3900)
MCH: 29.6 pg (ref 27.0–33.0)
MCHC: 32.4 g/dL (ref 32.0–36.0)
MCV: 91.4 fL (ref 80.0–100.0)
MONO ABS: 570 {cells}/uL (ref 200–950)
MPV: 9.6 fL (ref 7.5–12.5)
Monocytes Relative: 6 %
NEUTROS ABS: 6840 {cells}/uL (ref 1500–7800)
Neutrophils Relative %: 72 %
PLATELETS: 410 10*3/uL — AB (ref 140–400)
RBC: 3.82 MIL/uL — AB (ref 4.20–5.80)
RDW: 14.3 % (ref 11.0–15.0)
WBC: 9.5 10*3/uL (ref 3.8–10.8)

## 2016-03-11 NOTE — Patient Instructions (Signed)
1. Please have your labs done. I will call you once we discuss findings with Dr. Jena Gaussourk.

## 2016-03-11 NOTE — Progress Notes (Signed)
Primary Care Physician: Isabella Bowens, MD  Primary Gastroenterologist:  Roetta Sessions, MD   Chief Complaint  Patient presents with  . Abdominal Pain    HPI: Johnathan Chavez is a 75 y.o. male here for further evaluation of recent pancreatitis.   Patient initially hospitalized at [facility] 11/7 through 03/03/16 for acute onset pancreatitis of unclear etiology associated with hematemesis (no EGD performed suspected M-W tear). Patient underwent a CT abdomen pelvis without contrast November 7 at their facility showing slightly distended gallbladder without pericholecystic inflammation, evidence of acute pancreatitis, mural thickening in the descending and transverse portions of the duodenum probably secondary inflammation. Abdominal ultrasound revealed fluid adjacent to the gallbladder, no shadowing stones, common bile duct diameter 5 mm. Lipase at admission was greater than 30,000 and at discharge was 519 with upper limits of normal at 393. On admission also had mild elevation of total bilirubin of 1.2, AST of 49. Triglyceride level was normal at 133.  Day after discharge patient had ongoing right-sided abdominal pain and went to a local urgent care. At that time total bilirubin was up to 1.7, alkaline phosphatase 189, AST 97, ALT 77, white blood cell count 11,900. Hemoglobin 11.4. No lipase performed that I can find. Due to worsening numbers and patient's inability to be managed at home, he was referred back to ER. They went to [facility]. Upon arrival at their facility lipase was normal. Total bilirubin 1.6, alkaline phosphatase 181, AST 149, ALT 91. White blood cell count 12,000.   On November 10, he had another CT abdomen pelvis without contrast due to elevated creatinine confirming acute pancreatitis, duodenal wall thickening, small bilateral pleural effusions. Gallbladder appeared normal, no gallstones.   On November 11 had another right upper quadrant abdominal  ultrasound with reported gallbladder sludge or tiny stones but no acute cholecystitis and no common bile duct dilation.  On November 12 he had MRCP showing what appears to be normal gallbladder without filling defects. No common bile duct dilation or choledocholithiasis. Continued mild edema with surrounding fat stranding of the pancreas. Wall thickening involving the descending portion of the duodenum may reflect secondary inflammatory changes versus primary duodenitis.  Total bilirubin was 1.2 at discharge on the 13th, alkaline phosphatase 187, AST 42, ALT 73.  Patient reports that he was told sludge wasn't that bad and he did try ursodiol. States at discharge he was only provided with a prescription for iron however. Presents today feeling somewhat improved from his hospitalization. Describes pain just right of midline at the umbilicus associated with vomiting. Pain was severe that he would scream if he was touched. Still feeling somewhat lousy. Starting to eat more. Concerned about why they didn't take his gallbladder out. Constipation managed Linzess. No significant rectal bleeding. No melena.     Current Outpatient Prescriptions  Medication Sig Dispense Refill  . amLODipine (NORVASC) 10 MG tablet Take 10 mg by mouth daily.  0  . aspirin (GOODSENSE ASPIRIN) 325 MG tablet Take 325 mg by mouth daily.     . calcium-vitamin D (SM CALCIUM 500/VITAMIN D3) 500-400 MG-UNIT tablet Take 1 tablet by mouth daily.     . Cyanocobalamin (VITAMIN B 12 PO) Take 1,000 mg by mouth daily.    Marland Kitchen escitalopram (LEXAPRO) 10 MG tablet Take 10 mg by mouth daily.    . ferrous sulfate 325 (65 FE) MG tablet Take 1 tablet (325 mg total) by mouth daily with breakfast. 30 tablet 0  . FLUZONE HIGH-DOSE 0.5  ML SUSY TO BE ADMINISTERED BY PHARMACIST FOR IMMUNIZATION  0  . gabapentin (NEURONTIN) 300 MG capsule Take 300 mg by mouth 2 (two) times daily.     . Ginkgo Biloba 60 MG TABS Take 60 mg by mouth daily.    Marland Kitchen. glipiZIDE  (GLUCOTROL) 10 MG tablet Take 10 mg by mouth 3 (three) times daily.    . Glucosamine-Chondroit-Vit C-Mn (GLUCOSAMINE 1500 COMPLEX PO) Take 1,500 mg by mouth daily.    Marland Kitchen. linaclotide (LINZESS) 72 MCG capsule Take 72 mcg by mouth daily as needed (hard stool).     Marland Kitchen. lisinopril (PRINIVIL,ZESTRIL) 20 MG tablet Take 20 mg by mouth daily.     . metFORMIN (GLUCOPHAGE) 1000 MG tablet Take 1,000 mg by mouth 2 (two) times daily.    . Multiple Vitamin (MULTIVITAMIN WITH MINERALS) TABS tablet Take 1 tablet by mouth daily.    . Omega-3 Fatty Acids (FISH OIL) 1200 MG CAPS Take 1,200 mg by mouth daily.    Marland Kitchen. omeprazole (PRILOSEC) 20 MG capsule Take 20 mg by mouth daily.     Marland Kitchen. PREVNAR 13 SUSP injection TO BE ADMINISTERED BY PHARMACIST FOR IMMUNIZATION  0  . simvastatin (ZOCOR) 40 MG tablet Take 40 mg by mouth daily.     . traMADol (ULTRAM) 50 MG tablet Take 50 mg by mouth 2 (two) times daily as needed for moderate pain.   0  . ursodiol (ACTIGALL) 250 MG tablet Take 1 tablet (250 mg total) by mouth 2 (two) times daily. 60 tablet 0  . vitamin C (ASCORBIC ACID) 500 MG tablet Take 500 mg by mouth daily.     Marland Kitchen. warfarin (COUMADIN) 5 MG tablet Take 5 mg by mouth daily.      No current facility-administered medications for this visit.     Allergies as of 03/11/2016 - Review Complete 03/11/2016  Allergen Reaction Noted  . Codeine Nausea Only 02/19/2015   Past Medical History:  Diagnosis Date  . A-fib ([facility])   . CAD (coronary artery disease)   . CKD (chronic kidney disease) stage 2, GFR 60-89 ml/min   . GERD (gastroesophageal reflux disease)   . HTN (hypertension)   . Hyperlipidemia   . 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 APPENDECTOMY    . BACK SURGERY     X2  . CERVICAL DISC SURGERY    . COLONOSCOPY  04/2014   [facility]: moderate sized internal hemorrhoids, 2 tubular adenomas removed from colon. diverticulosis  .  CORONARY ARTERY BYPASS GRAFT  2001  . ESOPHAGOGASTRODUODENOSCOPY  01/2015   Dr. Allena KatzPatel: gastritis  . INGUINAL HERNIA REPAIR Left   . SHOULDER ARTHROSCOPY Right   . SIGMOIDOSCOPY  01/2015   Dr. Allena KatzPatel: internal hemorrhoids  . TONSILLECTOMY     Family History  Problem Relation Age of Onset  . Diabetes Mother   . Colon cancer Father     ?colon cancer, age early 6670   Social History   Social History  . Marital status: Married    Spouse name: N/A  . Number of children: N/A  . Years of education: N/A   Social History Main Topics  . Smoking status: Never Smoker  . Smokeless tobacco: Never Used  . Alcohol use No  . Drug use: No  . Sexual activity: Not Asked   Other Topics Concern  . None   Social History Narrative  . None  ROS:  General: Negative for  fever, chills,  See hpi ENT: Negative for hoarseness, difficulty swallowing , nasal congestion. CV: Negative for chest pain, angina, palpitations, dyspnea on exertion, peripheral edema.  Respiratory: Negative for dyspnea at rest, dyspnea on exertion, cough, sputum, wheezing.  GI: See history of present illness. GU:  Negative for dysuria, hematuria, urinary incontinence, urinary frequency, nocturnal urination.  Endo: Negative for unusual weight change.    Physical Examination:   BP 124/66   Pulse (!) 58   Temp 98.3 F (36.8 C) (Oral)   Ht 5\' 5"  (1.651 m)   Wt 178 lb 3.2 oz (80.8 kg)   BMI 29.65 kg/m   General: Well-nourished, well-developed in no acute distress.  Eyes: No icterus. Mouth: Oropharyngeal mucosa moist and pink , no lesions erythema or exudate. Lungs: Clear to auscultation bilaterally.  Heart: Regular rate and rhythm, no murmurs rubs or gallops.  Abdomen: Bowel sounds are normal, mild tenderness to right of umbilicus/ruq, nondistended, no hepatosplenomegaly or masses, no abdominal bruits or hernia , no rebound or guarding.   Extremities: No lower extremity edema. No clubbing or deformities. Neuro: Alert  and oriented x 4   Skin: Warm and dry, no jaundice.   Psych: Alert and cooperative, normal mood and affect.  Labs:  Lab Results  Component Value Date   CREATININE 1.22 03/07/2016   BUN 14 03/07/2016   NA 136 03/07/2016   K 4.3 03/07/2016   CL 107 03/07/2016   CO2 22 03/07/2016   Lab Results  Component Value Date   ALT 73 (H) 03/07/2016   AST 42 (H) 03/07/2016   ALKPHOS 187 (H) 03/07/2016   BILITOT 1.2 03/07/2016   Lab Results  Component Value Date   WBC 8.2 03/07/2016   HGB 9.7 (L) 03/07/2016   HCT 28.1 (L) 03/07/2016   MCV 86.2 03/07/2016   PLT 259 03/07/2016   Lab Results  Component Value Date   INR 2.97 03/07/2016   INR 2.76 03/06/2016   INR 1.69 03/04/2016    Imaging Studies: Ct Abdomen Pelvis Wo Contrast  Result Date: 03/05/2016 CLINICAL DATA:  Right abdominal and right flank pain. Elevated liver function studies. Recent episode of pancreatitis. [facility] [place] [facility]. EXAM: CT ABDOMEN AND PELVIS WITHOUT CONTRAST TECHNIQUE: Multidetector CT imaging of the abdomen and pelvis was performed following the standard protocol without IV contrast. COMPARISON:  None. FINDINGS: Lower chest: Small bilateral pleural effusions with basilar atelectasis, greater on the left. Postoperative changes in the mediastinum. Small esophageal hiatal hernia. Hepatobiliary: No focal liver abnormality is seen. No gallstones, gallbladder wall thickening, or biliary dilatation. Pancreas: Mild infiltration in the fat around the pancreas consistent with mild acute pancreatitis. No pancreatic ductal dilatation. No loculated fluid collections. Spleen: Normal in size without focal abnormality. Adrenals/Urinary Tract: No adrenal gland nodules. Prominent vascular calcifications demonstrated in the renal sinus arteries bilaterally. No hydronephrosis or hydroureter. No renal, ureteral, or bladder stones. Bladder is decompressed  without obvious bladder wall thickening. Prominent para renal fat bilaterally. Stomach/Bowel: Stomach is unremarkable. Small bowel are decompressed but there appears to be wall thickening in the duodenum suggesting duodenitis. Scattered stool throughout the colon without distention or apparent wall thickening. Appendix is not identified. Vascular/Lymphatic: Aortic atherosclerosis. No enlarged abdominal or pelvic lymph nodes. Extensive vascular calcifications throughout the abdomen and pelvis. Reproductive: Prostate gland is enlarged, measuring 4.6 cm diameter. Other: No abdominal wall hernia or abnormality. No abdominopelvic ascites.  Musculoskeletal: Postoperative and degenerative changes in the lumbar spine. No destructive bone lesions. IMPRESSION: Small bilateral pleural effusions with basilar atelectasis, greater on the left. Infiltration in the fat around the pancreas suggesting acute pancreatitis. Duodenal wall thickening suggesting duodenitis. No evidence of bowel obstruction. No renal or ureteral stone or obstruction. Diffuse vascular calcifications. Electronically Signed   By: Burman Nieves M.D.   On: 03/05/2016 00:15   Mr Abdomen Mrcp Vivien Rossetti Contast  Result Date: 03/06/2016 CLINICAL DATA:  Acute pancreatitis. EXAM: MRI ABDOMEN WITHOUT AND WITH CONTRAST (INCLUDING MRCP) TECHNIQUE: Multiplanar multisequence MR imaging of the abdomen was performed both before and after the administration of intravenous contrast. Heavily T2-weighted images of the biliary and pancreatic ducts were obtained, and three-dimensional MRCP images were rendered by post processing. CONTRAST:  18mL MULTIHANCE GADOBENATE DIMEGLUMINE 529 MG/ML IV SOLN COMPARISON:  Ultrasound from 03/05/2016 and CT abdomen pelvis from 03/04/2016. FINDINGS: Lower chest: Small to moderate bilateral pleural effusions identified. No pericardial effusion. Hepatobiliary: No focal liver abnormality identified. The gallbladder appears normal. No filling  defects identified within the gallbladder lumen. No common bile duct or intrahepatic bile duct dilatation. There is no evidence for choledocholithiasis. Pancreas: Mild edema with surrounding fat stranding. No pancreatic duct dilatation and no fluid collections identified to suggest pseudocyst formation. No findings identified to suggest pancreatic necrosis secondary to pancreatitis. Spleen:  Within normal limits in size and appearance. Adrenals/Urinary Tract: No masses identified. No evidence of hydronephrosis. Similar appearance of chronic bilateral perinephric fat stranding. Stomach/Bowel: Stomach appears normal. There is wall thickening involving the second portion of the duodenal C-loop, image 31 of series 12. No pathologic dilatation of the small or large bowel. Vascular/Lymphatic: No pathologically enlarged lymph nodes identified. No abdominal aortic aneurysm demonstrated. Other:  None. Musculoskeletal: No suspicious bone lesions identified. IMPRESSION: 1. Mild pancreatic edema compatible with the clinical history of pancreatitis. No complications of pancreatitis identified. No significant pancreatic duct dilatation, pancreatic necrosis or pseudocyst. 2. Wall thickening involving the descending portion of the duodenum which may reflect secondary inflammatory changes versus primary duodenitis. 3. Normal appearance of the gallbladder. No gallstones or evidence of choledocholithiasis. No biliary dilatation. 4. Electronically Signed   By: Signa Kell M.D.   On: 03/06/2016 15:11   US Abdomen Limited Ruq  Result Date: 03/05/2016 CLINICAL DATA:  75 y/o M; right upper quadrant pain for 1 day with transaminitis. EXAM: US ABDOMEN LIMITED - RIGHT UPPER QUADRANT COMPARISON:  02/03/2016 CT of the abdomen and pelvis. FINDINGS: Gallbladder: No gallstones or wall thickening visualized. No sonographic Murphy sign noted by sonographer. Echogenic non shadowing material within the dependent gallbladder is compatible with  sludge or tiny stones. Common bile duct: Diameter: 2.4 mm Liver: No focal lesion identified. Within normal limits in parenchymal echogenicity. Trace right pleural effusion. IMPRESSION: Gallbladder sludge or tiny stones. No secondary signs of cholecystitis. No common bile duct dilatation. Trace right pleural effusion. Electronically Signed   By: Mitzi Hansen M.D.   On: 03/05/2016 02:12

## 2016-03-12 LAB — COMPREHENSIVE METABOLIC PANEL
ALK PHOS: 185 U/L — AB (ref 40–115)
ALT: 59 U/L — AB (ref 9–46)
AST: 32 U/L (ref 10–35)
Albumin: 3.8 g/dL (ref 3.6–5.1)
BILIRUBIN TOTAL: 0.5 mg/dL (ref 0.2–1.2)
BUN: 18 mg/dL (ref 7–25)
CO2: 25 mmol/L (ref 20–31)
Calcium: 9.3 mg/dL (ref 8.6–10.3)
Chloride: 102 mmol/L (ref 98–110)
Creat: 1.44 mg/dL — ABNORMAL HIGH (ref 0.70–1.18)
GLUCOSE: 99 mg/dL (ref 65–99)
Potassium: 4.9 mmol/L (ref 3.5–5.3)
Sodium: 138 mmol/L (ref 135–146)
TOTAL PROTEIN: 6.8 g/dL (ref 6.1–8.1)

## 2016-03-12 LAB — LIPASE: LIPASE: 32 U/L (ref 7–60)

## 2016-03-14 ENCOUNTER — Telehealth: Payer: Self-pay

## 2016-03-14 NOTE — Progress Notes (Signed)
LFTs improved. H/H improved. Discussing labs with wife. Awaiting input from Dr. Christella HartiganJacobs, see telephone note.

## 2016-03-14 NOTE — Telephone Encounter (Signed)
Pt called to see if we have heard anything from RMR and/or [facility] about the next step. Please advise

## 2016-03-14 NOTE — Telephone Encounter (Signed)
Tried to call patient. Johnathan Chavez. I spoke to his wife Diane, patient had provided me with her phone number last week asking that I call her. Informed her that we are waiting to hear back from Dr. Wendall Papaan Jacobs to see if he felt like endoscopic ultrasound was necessary before potentially pursuing second opinion from general surgeon regarding cholecystectomy. Also went over his most recent labs, LFTs are improving. She wants to hold on ursodiol for now until we hear back from Dr. Christella HartiganJacobs. I will call her when I have further information.

## 2016-03-15 ENCOUNTER — Telehealth: Payer: Self-pay | Admitting: Internal Medicine

## 2016-03-15 NOTE — Progress Notes (Signed)
Please let patient or wife know that Dr. Christella HartiganJacobs, the GI doctor in [place] who does EUS does not think that the patient needs an EUS. Dr. Jena Gaussourk advises consideration of second opinion with local surgeon about taking the gallbladder out if patient agreeable. Other approach would be to wait to see if any additional attacks and then pursue gallbladder surgery.    Let me know if he wants to see another surgeon, we could send to Dr. Lovell SheehanJenkins per RMR. We should repeat his labs once more to make sure return to normal. CBC, CMET in 10 days.

## 2016-03-15 NOTE — Telephone Encounter (Signed)
Pt's wife called to speak with LSL. She said that she had spoken with her before and was to get back with her about referring patient to Dr Christella HartiganJacobs. She was following up on that. I told her that LSL had left the office for the day, but I would send her a message. Graciella BeltonDianne can be reached at [phone]

## 2016-03-15 NOTE — Telephone Encounter (Signed)
Spoke with the pts wife. See results note.

## 2016-03-16 ENCOUNTER — Other Ambulatory Visit: Payer: Self-pay

## 2016-03-16 DIAGNOSIS — R109 Unspecified abdominal pain: Secondary | ICD-10-CM

## 2016-03-16 NOTE — Assessment & Plan Note (Signed)
75 year old gentleman with history of A. fib on chronic Coumadin, chronic renal disease, diabetes, CAD who presents for further evaluation of recent acute pancreatitis. Initially [facility] and subsequently readmitted in a different facility, [facility] one day later. Details outlined above. With bump in LFTs would be suspicious for biliary etiology of pancreatitis. No evidence of indolent pancreatic tumor noted at time of CT, MRCP. Discussed with patient today, ursodiol with significant time could be beneficial in resolving small nonopacified gallstones but would consider second opinion with another general surgeon for possibility of cholecystectomy especially if he has another episode. In the meantime I have discussed case with Dr. Jena Gaussourk. We will recheck out to Dr. Rob Buntinganiel Jacobs as he was curbside on the case with patient was in [facility] to get his opinion regarding if endoscopic ultrasound should be performed as a next step. However we may have enough information to consider cholecystectomy. Further recommendations to follow.

## 2016-03-16 NOTE — Progress Notes (Signed)
CC'ED TO PCP 

## 2016-03-16 NOTE — Telephone Encounter (Signed)
See result note.  

## 2016-03-16 NOTE — Progress Notes (Signed)
Discussed with Raynelle FanningJulie this morning. Need to get ASAP appointment with Dr. Lovell SheehanJenkins, ?today? If patient develops worsening abd pain, then go to ER [facility] or nearest if needed.  Back down to clear liquid diet if having pain.

## 2016-03-18 ENCOUNTER — Emergency Department (HOSPITAL_COMMUNITY): Payer: Medicare Other

## 2016-03-18 ENCOUNTER — Inpatient Hospital Stay (HOSPITAL_COMMUNITY)
Admission: EM | Admit: 2016-03-18 | Discharge: 2016-03-20 | DRG: 392 | Disposition: A | Discharge status: Home / Self Care | Payer: Medicare Other | Attending: Internal Medicine | Admitting: Internal Medicine

## 2016-03-18 ENCOUNTER — Encounter (HOSPITAL_COMMUNITY): Payer: Self-pay | Admitting: Emergency Medicine

## 2016-03-18 DIAGNOSIS — N183 Chronic kidney disease, stage 3 unspecified: Secondary | ICD-10-CM | POA: Diagnosis present

## 2016-03-18 DIAGNOSIS — Z7901 Long term (current) use of anticoagulants: Secondary | ICD-10-CM

## 2016-03-18 DIAGNOSIS — Z7982 Long term (current) use of aspirin: Secondary | ICD-10-CM | POA: Diagnosis not present

## 2016-03-18 DIAGNOSIS — Z885 Allergy status to narcotic agent status: Secondary | ICD-10-CM | POA: Diagnosis not present

## 2016-03-18 DIAGNOSIS — Z833 Family history of diabetes mellitus: Secondary | ICD-10-CM | POA: Diagnosis not present

## 2016-03-18 DIAGNOSIS — K219 Gastro-esophageal reflux disease without esophagitis: Secondary | ICD-10-CM | POA: Diagnosis present

## 2016-03-18 DIAGNOSIS — I129 Hypertensive chronic kidney disease with stage 1 through stage 4 chronic kidney disease, or unspecified chronic kidney disease: Secondary | ICD-10-CM | POA: Diagnosis present

## 2016-03-18 DIAGNOSIS — R791 Abnormal coagulation profile: Secondary | ICD-10-CM | POA: Diagnosis not present

## 2016-03-18 DIAGNOSIS — I2510 Atherosclerotic heart disease of native coronary artery without angina pectoris: Secondary | ICD-10-CM | POA: Diagnosis present

## 2016-03-18 DIAGNOSIS — T45515A Adverse effect of anticoagulants, initial encounter: Secondary | ICD-10-CM | POA: Diagnosis present

## 2016-03-18 DIAGNOSIS — E119 Type 2 diabetes mellitus without complications: Secondary | ICD-10-CM

## 2016-03-18 DIAGNOSIS — K529 Noninfective gastroenteritis and colitis, unspecified: Secondary | ICD-10-CM

## 2016-03-18 DIAGNOSIS — Z7984 Long term (current) use of oral hypoglycemic drugs: Secondary | ICD-10-CM

## 2016-03-18 DIAGNOSIS — D689 Coagulation defect, unspecified: Secondary | ICD-10-CM | POA: Diagnosis present

## 2016-03-18 DIAGNOSIS — I4891 Unspecified atrial fibrillation: Secondary | ICD-10-CM | POA: Diagnosis present

## 2016-03-18 DIAGNOSIS — Z800 Family history of malignant neoplasm of digestive organs: Secondary | ICD-10-CM

## 2016-03-18 DIAGNOSIS — R1030 Lower abdominal pain, unspecified: Secondary | ICD-10-CM | POA: Diagnosis present

## 2016-03-18 DIAGNOSIS — E785 Hyperlipidemia, unspecified: Secondary | ICD-10-CM | POA: Diagnosis present

## 2016-03-18 DIAGNOSIS — E1122 Type 2 diabetes mellitus with diabetic chronic kidney disease: Secondary | ICD-10-CM | POA: Diagnosis present

## 2016-03-18 DIAGNOSIS — Z951 Presence of aortocoronary bypass graft: Secondary | ICD-10-CM | POA: Diagnosis not present

## 2016-03-18 LAB — COMPREHENSIVE METABOLIC PANEL
ALT: 27 U/L (ref 17–63)
ANION GAP: 8 (ref 5–15)
AST: 17 U/L (ref 15–41)
Albumin: 3.6 g/dL (ref 3.5–5.0)
Alkaline Phosphatase: 102 U/L (ref 38–126)
BUN: 29 mg/dL — ABNORMAL HIGH (ref 6–20)
CHLORIDE: 104 mmol/L (ref 101–111)
CO2: 24 mmol/L (ref 22–32)
CREATININE: 1.77 mg/dL — AB (ref 0.61–1.24)
Calcium: 9.3 mg/dL (ref 8.9–10.3)
GFR, EST AFRICAN AMERICAN: 42 mL/min — AB (ref >60)
GFR, EST NON AFRICAN AMERICAN: 36 mL/min — AB (ref >60)
Glucose, Bld: 211 mg/dL — ABNORMAL HIGH (ref 65–99)
POTASSIUM: 4.6 mmol/L (ref 3.5–5.1)
SODIUM: 136 mmol/L (ref 135–145)
Total Bilirubin: 0.6 mg/dL (ref 0.3–1.2)
Total Protein: 6.9 g/dL (ref 6.5–8.1)

## 2016-03-18 LAB — CBC
HCT: 33.8 % — ABNORMAL LOW (ref 39.0–52.0)
HEMOGLOBIN: 11 g/dL — AB (ref 13.0–17.0)
MCH: 29.7 pg (ref 26.0–34.0)
MCHC: 32.5 g/dL (ref 30.0–36.0)
MCV: 91.4 fL (ref 78.0–100.0)
PLATELETS: 362 10*3/uL (ref 150–400)
RBC: 3.7 MIL/uL — AB (ref 4.22–5.81)
RDW: 14.2 % (ref 11.5–15.5)
WBC: 13.2 10*3/uL — AB (ref 4.0–10.5)

## 2016-03-18 LAB — GLUCOSE, CAPILLARY: Glucose-Capillary: 150 mg/dL — ABNORMAL HIGH (ref 65–99)

## 2016-03-18 LAB — URINALYSIS, ROUTINE W REFLEX MICROSCOPIC
Bilirubin Urine: NEGATIVE
GLUCOSE, UA: NEGATIVE mg/dL
HGB URINE DIPSTICK: NEGATIVE
Ketones, ur: NEGATIVE mg/dL
LEUKOCYTES UA: NEGATIVE
Nitrite: NEGATIVE
PROTEIN: NEGATIVE mg/dL
SPECIFIC GRAVITY, URINE: 1.02 (ref 1.005–1.030)
pH: 5 (ref 5.0–8.0)

## 2016-03-18 LAB — PROTIME-INR
INR: >10
INR: >10

## 2016-03-18 LAB — I-STAT CG4 LACTIC ACID, ED
LACTIC ACID, VENOUS: 1.66 mmol/L (ref 0.5–1.9)
Lactic Acid, Venous: 1.81 mmol/L (ref 0.5–1.9)

## 2016-03-18 LAB — APTT: APTT: 108 s — AB (ref 24–36)

## 2016-03-18 LAB — TYPE AND SCREEN
ABO/RH(D): A NEG
Antibody Screen: NEGATIVE

## 2016-03-18 LAB — HEMOGLOBIN AND HEMATOCRIT, BLOOD
HEMATOCRIT: 35 % — AB (ref 39.0–52.0)
HEMOGLOBIN: 11.6 g/dL — AB (ref 13.0–17.0)

## 2016-03-18 LAB — TROPONIN I

## 2016-03-18 LAB — LIPASE, BLOOD: LIPASE: 21 U/L (ref 11–51)

## 2016-03-18 MED ORDER — ACETAMINOPHEN 325 MG PO TABS: 650.0000 mg | ORAL_TABLET | Freq: Four times a day (QID) | ORAL | Status: DC | PRN

## 2016-03-18 MED ORDER — ONDANSETRON HCL 4 MG PO TABS: 4.0000 mg | ORAL_TABLET | Freq: Four times a day (QID) | ORAL | Status: DC | PRN

## 2016-03-18 MED ORDER — SODIUM CHLORIDE 0.9 % IV BOLUS (SEPSIS)
500.0000 mL | Freq: Once | INTRAVENOUS | Status: AC
  Administered 2016-03-18: 500 mL via INTRAVENOUS

## 2016-03-18 MED ORDER — ACETAMINOPHEN 650 MG RE SUPP: 650.0000 mg | Freq: Four times a day (QID) | RECTAL | Status: DC | PRN

## 2016-03-18 MED ORDER — MORPHINE SULFATE (PF) 2 MG/ML IV SOLN
1.0000 mg | INTRAVENOUS | Status: DC | PRN
  Administered 2016-03-18 – 2016-03-20 (×3): 1 mg via INTRAVENOUS
  Filled 2016-03-18 (×3): qty 1

## 2016-03-18 MED ORDER — ONDANSETRON HCL 4 MG/2ML IJ SOLN
4.0000 mg | Freq: Once | INTRAMUSCULAR | Status: DC
  Filled 2016-03-18: qty 2

## 2016-03-18 MED ORDER — INSULIN ASPART 100 UNIT/ML ~~LOC~~ SOLN
0.0000 [IU] | Freq: Three times a day (TID) | SUBCUTANEOUS | Status: DC
  Administered 2016-03-19: 2 [IU] via SUBCUTANEOUS

## 2016-03-18 MED ORDER — INSULIN ASPART 100 UNIT/ML ~~LOC~~ SOLN: 0.0000 [IU] | Freq: Every day | SUBCUTANEOUS | Status: DC

## 2016-03-18 MED ORDER — SODIUM CHLORIDE 0.9 % IV SOLN
Freq: Once | INTRAVENOUS | Status: DC
Start: 2016-03-18 — End: 2016-03-20

## 2016-03-18 MED ORDER — IOPAMIDOL (ISOVUE-300) INJECTION 61%
75.0000 mL | Freq: Once | INTRAVENOUS | Status: AC | PRN
  Administered 2016-03-18: 75 mL via INTRAVENOUS

## 2016-03-18 MED ORDER — VITAMIN K1 10 MG/ML IJ SOLN
10.0000 mg | INTRAVENOUS | Status: AC
  Administered 2016-03-18: 10 mg via INTRAVENOUS
  Filled 2016-03-18: qty 1

## 2016-03-18 MED ORDER — IOPAMIDOL (ISOVUE-300) INJECTION 61%
INTRAVENOUS | Status: AC
  Administered 2016-03-18: 30 mL via ORAL
  Filled 2016-03-18: qty 30

## 2016-03-18 MED ORDER — ONDANSETRON HCL 4 MG/2ML IJ SOLN: 4.0000 mg | Freq: Four times a day (QID) | INTRAMUSCULAR | Status: DC | PRN

## 2016-03-18 MED ORDER — PIPERACILLIN-TAZOBACTAM 3.375 G IVPB
3.3750 g | Freq: Three times a day (TID) | INTRAVENOUS | Status: DC
  Administered 2016-03-18 – 2016-03-20 (×6): 3.375 g via INTRAVENOUS
  Filled 2016-03-18 (×6): qty 50

## 2016-03-18 MED ORDER — PROTHROMBIN COMPLEX CONC HUMAN 500 UNITS IV KIT
4000.0000 [IU] | PACK | Status: AC
  Administered 2016-03-18: 4000 [IU] via INTRAVENOUS
  Filled 2016-03-18: qty 160

## 2016-03-18 MED ORDER — SODIUM CHLORIDE 0.9 % IV SOLN
INTRAVENOUS | Status: DC
  Administered 2016-03-18: 18:00:00 via INTRAVENOUS

## 2016-03-18 NOTE — ED Notes (Signed)
K centra infusion completed and flushed.

## 2016-03-18 NOTE — Progress Notes (Signed)
Pharmacy Antibiotic Note  Evlyn KannerWilliam R Hackley is a 75 y.o. male admitted on 03/18/2016 with intra-abdominal infection.  Pharmacy has been consulted for Zosyn dosing.  Plan: Zosyn 3.375g IV q8h (4 hour infusion).  Labs per protocol  Height: 5\' 5"  (165.1 cm) Weight: 180 lb 14.4 oz (82.1 kg) IBW/kg (Calculated) : 61.5  Temp (24hrs), Avg:98.1 F (36.7 C), Min:97.2 F (36.2 C), Max:98.8 F (37.1 C)   Recent Labs Lab 03/18/16 1035 03/18/16 1143 03/18/16 1517  WBC 13.2*  --   --   CREATININE 1.77*  --   --   LATICACIDVEN  --  1.66 1.81    Estimated Creatinine Clearance: 36.1 mL/min (by C-G formula based on SCr of 1.77 mg/dL (H)).    Allergies  Allergen Reactions  . Codeine Nausea Only    Antimicrobials this admission: Zosyn 11/24  >>     Dose adjustments this admission:     Thank you for allowing pharmacy to be a part of this patient's care.  Josephine Igoittman, Anja Neuzil Bennett 03/18/2016 5:16 PM

## 2016-03-18 NOTE — ED Notes (Signed)
Pt using urinal at this time.

## 2016-03-18 NOTE — ED Provider Notes (Signed)
3:55 PM Dr. Ardyth HarpsHernandez asks for med-surg inpatient bed. Bed request placed   Pricilla LovelessScott Aubert Choyce, MD 03/18/16 1556

## 2016-03-18 NOTE — ED Triage Notes (Signed)
Pt reports nausea/vomiting, abd pain since November 7th. Pt reports history of pancreatitis. Pt reports has not had BM in last several days. nad noted. Pt pale,alert, and oriented in triage.

## 2016-03-18 NOTE — H&P (Signed)
History and Physical    Johnathan KannerWilliam R Baik [id number] DOB: 01/31/1941 DOA: 03/18/2016  Referring MD/NP/PA: Loren Raceravid Yelverton, EDP PCP: Isabella BowensVASIREDDY,VENUGOPAL KIRAN, MD  Patient coming from: Home  Chief Complaint: Abdominal pain/n/v  HPI: Johnathan Chavez is a 75 y.o. male with h/o pancreatitis, a fib, DM II, CAD and CKD Stage II, here today with above symptoms that began about 12 hours prior to presentation. He has recently had 2 hospitalizations at outside facilities due to acute pancreatitis and thought it had flared up again. His lower abdomen has been exquisitely tender, hurts when walking or coughing. Work up in the ED shows and INR >10, WBCs 13.2, Cr 1.77 (baseline 1.44). Ct abdomen with small bowel wall thickening c/w segmental enteritis. CT also makes a mention of some free fluid posterior to the inflamed bowel that based on density probably represents hemorrhagic fluid. Given concerns for acute abdomen surgical consultation was requested by EDP. They recommend reversal of INR and watchful waiting for now. Admission has been requested for further evaluation and management.  Past Medical/Surgical History: Past Medical History:  Diagnosis Date  . A-fib (HCC)   . CAD (coronary artery disease)   . CKD (chronic kidney disease) stage 2, GFR 60-89 ml/min   . GERD (gastroesophageal reflux disease)   . HTN (hypertension)   . Hyperlipidemia   . 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 APPENDECTOMY    . BACK SURGERY     X2  . CERVICAL DISC SURGERY    . COLONOSCOPY  04/2014   [facility]: moderate sized internal hemorrhoids, 2 tubular adenomas removed from colon. diverticulosis  . CORONARY ARTERY BYPASS GRAFT  2001  . ESOPHAGOGASTRODUODENOSCOPY  01/2015   Dr. Allena KatzPatel: gastritis  . INGUINAL HERNIA REPAIR Left   . SHOULDER ARTHROSCOPY Right   . SIGMOIDOSCOPY  01/2015   Dr. Allena KatzPatel: internal hemorrhoids  .  TONSILLECTOMY      Social History:  reports that he has never smoked. He has never used smokeless tobacco. He reports that he does not drink alcohol or use drugs.  Allergies: Allergies  Allergen Reactions  . Codeine Nausea Only    Family History:  Family History  Problem Relation Age of Onset  . Diabetes Mother   . Colon cancer Father     ?colon cancer, age early 3570    Prior to Admission medications   Medication Sig Start Date End Date Taking? Authorizing Provider  amLODipine (NORVASC) 10 MG tablet Take 10 mg by mouth daily. 12/08/15  Yes Historical Provider, MD  aspirin (GOODSENSE ASPIRIN) 325 MG tablet Take 325 mg by mouth daily.    Yes Historical Provider, MD  calcium-vitamin D (SM CALCIUM 500/VITAMIN D3) 500-400 MG-UNIT tablet Take 1 tablet by mouth daily.    Yes Historical Provider, MD  Cyanocobalamin (VITAMIN B 12 PO) Take 1,000 mg by mouth daily.   Yes Historical Provider, MD  escitalopram (LEXAPRO) 10 MG tablet Take 10 mg by mouth daily.   Yes Historical Provider, MD  ferrous sulfate 325 (65 FE) MG tablet Take 1 tablet (325 mg total) by mouth daily with breakfast. 03/07/16  Yes Clanford L Johnson, MD  gabapentin (NEURONTIN) 300 MG capsule Take 300 mg by mouth 2 (two) times daily.    Yes Historical Provider, MD  Ginkgo Biloba 60 MG TABS Take 60 mg by mouth daily.   Yes Historical Provider, MD  glipiZIDE (GLUCOTROL) 10 MG  tablet Take 10 mg by mouth 3 (three) times daily.   Yes Historical Provider, MD  Glucosamine-Chondroit-Vit C-Mn (GLUCOSAMINE 1500 COMPLEX PO) Take 1,500 mg by mouth daily.   Yes Historical Provider, MD  linaclotide (LINZESS) 72 MCG capsule Take 72 mcg by mouth daily as needed (hard stool).    Yes Historical Provider, MD  lisinopril (PRINIVIL,ZESTRIL) 20 MG tablet Take 20 mg by mouth daily.    Yes Historical Provider, MD  metFORMIN (GLUCOPHAGE) 1000 MG tablet Take 1,000 mg by mouth 2 (two) times daily.   Yes Historical Provider, MD  Multiple Vitamin  (MULTIVITAMIN WITH MINERALS) TABS tablet Take 1 tablet by mouth daily.   Yes Historical Provider, MD  Omega-3 Fatty Acids (FISH OIL) 1200 MG CAPS Take 1,200 mg by mouth daily.   Yes Historical Provider, MD  omeprazole (PRILOSEC) 20 MG capsule Take 20 mg by mouth daily.    Yes Historical Provider, MD  simvastatin (ZOCOR) 40 MG tablet Take 40 mg by mouth daily.    Yes Historical Provider, MD  traMADol (ULTRAM) 50 MG tablet Take 50 mg by mouth 2 (two) times daily as needed for moderate pain.  10/13/15  Yes Historical Provider, MD  ursodiol (ACTIGALL) 250 MG tablet Take 1 tablet (250 mg total) by mouth 2 (two) times daily. 03/07/16  Yes Clanford Cyndie Mull, MD  vitamin C (ASCORBIC ACID) 500 MG tablet Take 500 mg by mouth daily.    Yes Historical Provider, MD  warfarin (COUMADIN) 5 MG tablet Take 5 mg by mouth daily.    Yes Historical Provider, MD  FLUZONE HIGH-DOSE 0.5 ML SUSY TO BE ADMINISTERED BY PHARMACIST FOR IMMUNIZATION 02/19/16   Historical Provider, MD  PREVNAR 13 SUSP injection TO BE ADMINISTERED BY PHARMACIST FOR IMMUNIZATION 02/19/16   Historical Provider, MD    Review of Systems:  Constitutional: Denies fever, chills, diaphoresis, appetite change and fatigue.  HEENT: Denies photophobia, eye pain, redness, hearing loss, ear pain, congestion, sore throat, rhinorrhea, sneezing, mouth sores, trouble swallowing, neck pain, neck stiffness and tinnitus.   Respiratory: Denies SOB, DOE, cough, chest tightness,  and wheezing.   Cardiovascular: Denies chest pain, palpitations and leg swelling.  Gastrointestinal: Denies diarrhea, constipation, blood in stool and abdominal distention.  Genitourinary: Denies dysuria, urgency, frequency, hematuria, flank pain and difficulty urinating.  Endocrine: Denies: hot or cold intolerance, sweats, changes in hair or nails, polyuria, polydipsia. Musculoskeletal: Denies myalgias, back pain, joint swelling, arthralgias and gait problem.  Skin: Denies pallor, rash and  wound.  Neurological: Denies dizziness, seizures, syncope, weakness, light-headedness, numbness and headaches.  Hematological: Denies adenopathy. Easy bruising, personal or family bleeding history  Psychiatric/Behavioral: Denies suicidal ideation, mood changes, confusion, nervousness, sleep disturbance and agitation    Physical Exam: Vitals:   03/18/16 1530 03/18/16 1600 03/18/16 1622 03/18/16 1645  BP: 125/73 106/63  120/61  Pulse: 60 65  67  Resp: 20 17  18   Temp:   98.8 F (37.1 C) 98.4 F (36.9 C)  TempSrc:   Oral Oral  SpO2: 98% 98%  97%  Weight:    82.1 kg (180 lb 14.4 oz)  Height:    5\' 5"  (1.651 m)     Constitutional: NAD, calm,  Eyes: PERRL, lids and conjunctivae normal ENMT: Mucous membranes are moist. Posterior pharynx clear of any exudate or lesions.Normal dentition.  Neck: normal, supple, no masses, no thyromegaly Respiratory: clear to auscultation bilaterally, no wheezing, no crackles. Normal respiratory effort. No accessory muscle use.  Cardiovascular: Regular rate and rhythm, no  murmurs / rubs / gallops. No extremity edema. 2+ pedal pulses. No carotid bruits.  Abdomen: hypoactive BS, exquisitely TTP to the lower abdomen with significant rigidity and guarding Musculoskeletal: no clubbing / cyanosis. No joint deformity upper and lower extremities. Good ROM, no contractures. Normal muscle tone.  Skin: no rashes, lesions, ulcers. No induration Neurologic: CN 2-12 grossly intact. Sensation intact, DTR normal. Strength 5/5 in all 4.  Psychiatric: Normal judgment and insight. Alert and oriented x 3. Normal mood.    Labs on Admission: I have personally reviewed the following labs and imaging studies  CBC:  Recent Labs Lab 03/18/16 1035 03/18/16 1430  WBC 13.2*  --   HGB 11.0* 11.6*  HCT 33.8* 35.0*  MCV 91.4  --   PLT 362  --    Basic Metabolic Panel:  Recent Labs Lab 03/18/16 1035  NA 136  K 4.6  CL 104  CO2 24  GLUCOSE 211*  BUN 29*  CREATININE  1.77*  CALCIUM 9.3   GFR: Estimated Creatinine Clearance: 36.1 mL/min (by C-G formula based on SCr of 1.77 mg/dL (H)). Liver Function Tests:  Recent Labs Lab 03/18/16 1035  AST 17  ALT 27  ALKPHOS 102  BILITOT 0.6  PROT 6.9  ALBUMIN 3.6    Recent Labs Lab 03/18/16 1035  LIPASE 21   No results for input(s): AMMONIA in the last 168 hours. Coagulation Profile:  Recent Labs Lab 03/18/16 1055 03/18/16 1403  INR >10.00* >10.00*   Cardiac Enzymes:  Recent Labs Lab 03/18/16 1055  TROPONINI <0.03   BNP (last 3 results) No results for input(s): PROBNP in the last 8760 hours. HbA1C: No results for input(s): HGBA1C in the last 72 hours. CBG: No results for input(s): GLUCAP in the last 168 hours. Lipid Profile: No results for input(s): CHOL, HDL, LDLCALC, TRIG, CHOLHDL, LDLDIRECT in the last 72 hours. Thyroid Function Tests: No results for input(s): TSH, T4TOTAL, FREET4, T3FREE, THYROIDAB in the last 72 hours. Anemia Panel: No results for input(s): VITAMINB12, FOLATE, FERRITIN, TIBC, IRON, RETICCTPCT in the last 72 hours. Urine analysis:    Component Value Date/Time   COLORURINE YELLOW 03/18/2016 1435   APPEARANCEUR CLEAR 03/18/2016 1435   LABSPEC 1.020 03/18/2016 1435   PHURINE 5.0 03/18/2016 1435   GLUCOSEU NEGATIVE 03/18/2016 1435   HGBUR NEGATIVE 03/18/2016 1435   BILIRUBINUR NEGATIVE 03/18/2016 1435   KETONESUR NEGATIVE 03/18/2016 1435   PROTEINUR NEGATIVE 03/18/2016 1435   NITRITE NEGATIVE 03/18/2016 1435   LEUKOCYTESUR NEGATIVE 03/18/2016 1435   Sepsis Labs: @LABRCNTIP (procalcitonin:4,lacticidven:4) )No results found for this or any previous visit (from the past 240 hour(s)).   Radiological Exams on Admission: Ct Abdomen Pelvis W Contrast  Result Date: 03/18/2016 CLINICAL DATA:  Abdominal pain, type 2 diabetes, chronic kidney disease, coughing up blood EXAM: CT ABDOMEN AND PELVIS WITH CONTRAST TECHNIQUE: Multidetector CT imaging of the abdomen and  pelvis was performed using the standard protocol following bolus administration of intravenous contrast. CONTRAST:  30mL ISOVUE-300 IOPAMIDOL (ISOVUE-300) INJECTION 61%, 75mL ISOVUE-300 IOPAMIDOL (ISOVUE-300) INJECTION 61% COMPARISON:  None. FINDINGS: Lower chest: No acute abnormality Hepatobiliary: There is fatty infiltration of the liver. No focal hepatic mass. No calcified gallstones are noted within gallbladder. Pancreas: Enhanced pancreas shows no focal mass. No evidence of acute pancreatitis. Spleen: Enhanced spleen is normal. Adrenals/Urinary Tract: Kidneys are symmetrical in size and enhancement. No hydronephrosis or hydroureter. Extensive vascular renal calcifications bilaterally again noted. The urinary bladder is unremarkable. Stomach/Bowel: No gastric outlet obstruction. There is significant segmental thickening  of small bowel wall in mid pelvis axial image 66. There is stranding of adjacent mesentery and probable mesenteric edema or inflammation. Findings are consistent with segmental acute enteritis. This may be infectious or ischemic in nature. Less likely neoplastic process. Clinical correlation is necessary. No pericecal inflammation. There is some free fluid in the upper pelvis just posterior to inflamed small bowel axial image 73. The fluid measures 39 Hounsfield units probable hemorrhagic fluid. Clinical correlation is necessary. Vascular/Lymphatic: Atherosclerotic calcifications of abdominal aorta and iliac arteries. No aortic aneurysm. Atherosclerotic calcifications are noted splenic artery celiac trunk SMA origin. Reproductive: Prostate gland and seminal vesicles are unremarkable. Other: Small free fluid within pelvis measures 22 Hounsfield units in attenuation probable hemorrhagic fluid. No evidence of free abdominal air. No evidence of bowel perforation. Musculoskeletal: No destructive bony lesions are noted. There is posterior fusion L4-L5 level. Significant disc space flattening with  endplate sclerotic changes vacuum disc phenomenon at L3-L4 level. IMPRESSION: 1. There is significant segmental thickening of small bowel wall in mid pelvis axial image 66. There is stranding of adjacent mesentery and probable mesenteric edema or inflammation. Findings are consistent with segmental acute enteritis. This may be infectious or ischemic in nature. Less likely neoplastic process. Clinical correlation is necessary. Less likely incarcerated internal hernia but not excluded go. No evidence of small bowel obstruction. 2. Small free fluid noted within pelvis. 3. No evidence of acute pancreatitis. 4. No pericecal inflammation. 5. Extensive atherosclerotic vascular calcifications as described above. 6. No hydronephrosis or hydroureter. These results were called by telephone at the time of interpretation on 03/18/2016 at 1:07 pm to Dr. Loren RacerAVID YELVERTON , who verbally acknowledged these results. Electronically Signed   By: Natasha MeadLiviu  Pop M.D.   On: 03/18/2016 13:14   Dg Chest Port 1 View  Result Date: 03/18/2016 CLINICAL DATA:  Productive cough EXAM: PORTABLE CHEST 1 VIEW COMPARISON:  None. FINDINGS: Cardiac shadow is mildly enlarged. Postsurgical changes are noted. The overall inspiratory effort is poor although no focal infiltrate or sizable effusion is seen. No bony abnormality is noted. IMPRESSION: No active disease. Electronically Signed   By: Alcide CleverMark  Lukens M.D.   On: 03/18/2016 11:47    EKG: Independently reviewed. A Fib, rate 60, no acute ischemic changes  Assessment/Plan Principal Problem:   Supratherapeutic INR Active Problems:   Enteritis   Atrial fibrillation (HCC)   CKD (chronic kidney disease), stage III   CAD (coronary artery disease)   Diabetes mellitus type II, non insulin dependent (HCC)   Coagulopathy (HCC)    Abdominal Pain/Enteritis -Etiology remains unclear: infection not excluded, also ?hemorrhage into small bowel given coagulopathy. -Zosyn, coumadin reversal. -Treat  supportively with pain/nausea meds. -Keep NPO tonight. -Surgery on board. No clear role for surgery at this time.  A Fib -Rate controlled. -Holding coumadin given above.  Coagulopathy -Iatrogenic, due to coumadin dosing. -Hold coumadin. -Given vit k 10 mg in ED. -Will give 2 units of FFP now; check INR after transfusion complete to determine need for further Vit K/FFP.  CKD Stage III -Cr is close to his baseline of 1.4, give IVF.  DM II -Holding metformin. -Check A1C. -SSI.   DVT prophylaxis: Coagulopathic at present with INR >10  Code Status: full code  Family Communication: wife at bedside updated on plan of care and all questions answered  Disposition Plan: to be determined base on clinical course  Consults called: surgery  Admission status: inpatient    Time Spent: 85 minutes  Chaya JanHERNANDEZ ACOSTA,ESTELA MD [facility]  Pager [phone]  If 7PM-7AM, please contact night-coverage [email or web address] Password [email or web address]  03/18/2016, 5:15 PM

## 2016-03-18 NOTE — Consult Note (Signed)
SURGICAL CONSULTATION NOTE (initial) - cpt: 99244 (Outpatient/ED)  HISTORY OF PRESENT ILLNESS (HPI):  75 y.o. male [facility] following treatment for acute ideopathic pancreatitis and duodenitis presented with gradual onset of lower abdominal pain x ~24 hours. Patient reports that in retrospect he felt a little abdominal discomfort early Thanksgiving morning, but it was mild and did not prevent him from eating and enjoying Thanksgiving or going to sleep last night. However, around 2 am, he experienced mild worsening of his abdominal pain that became severe by ~3 am, at which time he woke his wife, who advised him to seek [facility]. Patient also reports that prior to his experience with pancreatitis, he was unaware that his cardiologist decreased his Coumadin prescription, and he learned when evaluated for pancreatitis that he was subtherapeutic. Upon discharge, he says he was told to take 10 mg Coumadin daily instead of 2.5 mg daily with 5 mg twice per week (or the 1.25 mg daily and 2.5 mg twice per week that he was taking prior to his recent hospitalization). While in the ED, he says his pain has decreased somewhat with resolution of his nausea/vomiting. He continues to pass flatus, though he says he has not passed a BM in 3 days. Patient otherwise denies fever/chills, CP, or SOB, and he denies any history of embolic events (stroke, acute limb ischemia, or acute bowel ischemia) with his a-fib.  Surgery is consulted by ED physician Dr. Ranae PalmsYelverton in this context for evaluation and management of abdominal pain.  PAST MEDICAL HISTORY (PMH):  Past Medical History:  Diagnosis Date  . A-fib (HCC)   . CAD (coronary artery disease)   . CKD (chronic kidney disease) stage 2, GFR 60-89 ml/min   . GERD (gastroesophageal reflux disease)   . HTN (hypertension)   . Hyperlipidemia   . 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 APPENDECTOMY    . BACK SURGERY     X2  . CERVICAL DISC SURGERY    . COLONOSCOPY  04/2014   [facility]: moderate sized internal hemorrhoids, 2 tubular adenomas removed from colon. diverticulosis  . CORONARY ARTERY BYPASS GRAFT  2001  . ESOPHAGOGASTRODUODENOSCOPY  01/2015   Dr. Allena KatzPatel: gastritis  . INGUINAL HERNIA REPAIR Left   . SHOULDER ARTHROSCOPY Right   . SIGMOIDOSCOPY  01/2015   Dr. Allena KatzPatel: internal hemorrhoids  . TONSILLECTOMY       MEDICATIONS:  Prior to Admission medications   Medication Sig Start Date End Date Taking? Authorizing Provider  amLODipine (NORVASC) 10 MG tablet Take 10 mg by mouth daily. 12/08/15  Yes Historical Provider, MD  aspirin (GOODSENSE ASPIRIN) 325 MG tablet Take 325 mg by mouth daily.    Yes Historical Provider, MD  calcium-vitamin D (SM CALCIUM 500/VITAMIN D3) 500-400 MG-UNIT tablet Take 1 tablet by mouth daily.    Yes Historical Provider, MD  Cyanocobalamin (VITAMIN B 12 PO) Take 1,000 mg by mouth daily.   Yes Historical Provider, MD  escitalopram (LEXAPRO) 10 MG tablet Take 10 mg by mouth daily.   Yes Historical Provider, MD  ferrous sulfate 325 (65 FE) MG tablet Take 1 tablet (325 mg total) by mouth daily with breakfast. 03/07/16  Yes Clanford L Johnson, MD  gabapentin (NEURONTIN) 300 MG capsule Take 300 mg by mouth 2 (two) times daily.  Yes Historical Provider, MD  Ginkgo Biloba 60 MG TABS Take 60 mg by mouth daily.   Yes Historical Provider, MD  glipiZIDE (GLUCOTROL) 10 MG tablet Take 10 mg by mouth 3 (three) times daily.   Yes Historical Provider, MD  Glucosamine-Chondroit-Vit C-Mn (GLUCOSAMINE 1500 COMPLEX PO) Take 1,500 mg by mouth daily.   Yes Historical Provider, MD  linaclotide (LINZESS) 72 MCG capsule Take 72 mcg by mouth daily as needed (hard stool).    Yes Historical Provider, MD  lisinopril (PRINIVIL,ZESTRIL) 20 MG tablet Take 20 mg by mouth  daily.    Yes Historical Provider, MD  metFORMIN (GLUCOPHAGE) 1000 MG tablet Take 1,000 mg by mouth 2 (two) times daily.   Yes Historical Provider, MD  Multiple Vitamin (MULTIVITAMIN WITH MINERALS) TABS tablet Take 1 tablet by mouth daily.   Yes Historical Provider, MD  Omega-3 Fatty Acids (FISH OIL) 1200 MG CAPS Take 1,200 mg by mouth daily.   Yes Historical Provider, MD  omeprazole (PRILOSEC) 20 MG capsule Take 20 mg by mouth daily.    Yes Historical Provider, MD  simvastatin (ZOCOR) 40 MG tablet Take 40 mg by mouth daily.    Yes Historical Provider, MD  traMADol (ULTRAM) 50 MG tablet Take 50 mg by mouth 2 (two) times daily as needed for moderate pain.  10/13/15  Yes Historical Provider, MD  ursodiol (ACTIGALL) 250 MG tablet Take 1 tablet (250 mg total) by mouth 2 (two) times daily. 03/07/16  Yes Clanford Cyndie Mull, MD  vitamin C (ASCORBIC ACID) 500 MG tablet Take 500 mg by mouth daily.    Yes Historical Provider, MD  warfarin (COUMADIN) 5 MG tablet Take 5 mg by mouth daily.    Yes Historical Provider, MD  FLUZONE HIGH-DOSE 0.5 ML SUSY TO BE ADMINISTERED BY PHARMACIST FOR IMMUNIZATION 02/19/16   Historical Provider, MD  PREVNAR 13 SUSP injection TO BE ADMINISTERED BY PHARMACIST FOR IMMUNIZATION 02/19/16   Historical Provider, MD     ALLERGIES:  Allergies  Allergen Reactions  . Codeine Nausea Only     SOCIAL HISTORY:  Social History   Social History  . Marital status: Married    Spouse name: N/A  . Number of children: N/A  . Years of education: N/A   Occupational History  . Not on file.   Social History Main Topics  . Smoking status: Never Smoker  . Smokeless tobacco: Never Used  . Alcohol use No  . Drug use: No  . Sexual activity: Not on file   Other Topics Concern  . Not on file   Social History Narrative  . No narrative on file    The patient currently resides (home / rehab facility / nursing home): Home  The patient normally is (ambulatory / bedbound): Ambulatory    FAMILY HISTORY:  Family History  Problem Relation Age of Onset  . Diabetes Mother   . Colon cancer Father     ?colon cancer, age early 1    REVIEW OF SYSTEMS:  Constitutional: denies weight loss, fever, chills, or sweats  Eyes: denies any other vision changes, history of eye injury  ENT: denies sore throat, hearing problems  Respiratory: denies shortness of breath, wheezing  Cardiovascular: denies chest pain, palpitations  Gastrointestinal: abdominal pain, N/V, and bowel function as per HPI Genitourinary: denies burning with urination or urinary frequency Musculoskeletal: denies any other joint pains or cramps  Skin: denies any other rashes or skin discolorations  Neurological: denies any other headache, dizziness, weakness  Psychiatric: denies  any other depression, anxiety   All other review of systems were negative   VITAL SIGNS:  Temp:  [97.2 F (36.2 C)] 97.2 F (36.2 C) (11/24 1027) Pulse Rate:  [58-101] 61 (11/24 1330) Resp:  [17-21] 18 (11/24 1330) BP: (85-133)/(48-73) 118/73 (11/24 1330) SpO2:  [95 %-100 %] 97 % (11/24 1330) Weight:  [81.6 kg (180 lb)] 81.6 kg (180 lb) (11/24 1028)     Height: 5\' 5"  (165.1 cm) Weight: 81.6 kg (180 lb) BMI (Calculated): 30   INTAKE/OUTPUT:  This shift: No intake/output data recorded.  Last 2 shifts: @IOLAST2SHIFTS @   PHYSICAL EXAM:  Constitutional:  -- Normal body habitus  -- Awake, alert, and oriented x3  Eyes:  -- Pupils equally round and reactive to light  -- No scleral icterus  Ear, nose, and throat:  -- No jugular venous distension  Pulmonary:  -- No crackles  -- Equal breath sounds bilaterally -- Breathing non-labored at rest Cardiovascular:  -- S1, S2 present  -- No pericardial rubs Gastrointestinal:  -- Abdomen soft, moderate lower midline abdominal tenderness with mild abdominal tenderness to palpation otherwise, non-distended with no guarding/rebound  -- No abdominal masses appreciated, pulsatile or  otherwise  Musculoskeletal and Integumentary:  -- Wounds or skin discoloration: well-healed oblique RLQ abdominal incisional scar (remote appendectomy) -- Extremities: B/L UE and LE FROM, hands and feet warm, no edema  Neurologic:  -- Motor function: intact and symmetric -- Sensation: intact and symmetric  Labs: CBC Latest Ref Rng & Units 03/18/2016 03/11/2016 03/07/2016  WBC 4.0 - 10.5 K/uL 13.2(H) 9.5 8.2  Hemoglobin 13.0 - 17.0 g/dL 11.0(L) 11.3(L) 9.7(L)  Hematocrit 39.0 - 52.0 % 33.8(L) 34.9(L) 28.1(L)  Platelets 150 - 400 K/uL 362 410(H) 259   CMP Latest Ref Rng & Units 03/18/2016 03/11/2016 03/07/2016  Glucose 65 - 99 mg/dL 161(W211(H) 99 960(A167(H)  BUN 6 - 20 mg/dL 54(U29(H) 18 14  Creatinine 0.61 - 1.24 mg/dL 9.81(X1.77(H) 9.14(N1.44(H) 8.291.22  Sodium 135 - 145 mmol/L 136 138 136  Potassium 3.5 - 5.1 mmol/L 4.6 4.9 4.3  Chloride 101 - 111 mmol/L 104 102 107  CO2 22 - 32 mmol/L 24 25 22   Calcium 8.9 - 10.3 mg/dL 9.3 9.3 5.6(O8.6(L)  Total Protein 6.5 - 8.1 g/dL 6.9 6.8 6.1(L)  Total Bilirubin 0.3 - 1.2 mg/dL 0.6 0.5 1.2  Alkaline Phos 38 - 126 U/L 102 185(H) 187(H)  AST 15 - 41 U/L 17 32 42(H)  ALT 17 - 63 U/L 27 59(H) 73(H)    Imaging studies:  CT Abdomen and Pelvis with Contrast (03/18/2016) 1. There is significant segmental thickening of small bowel wall in mid pelvis axial image 66. There is stranding of adjacent mesentery and probable mesenteric edema or inflammation. Findings are consistent with segmental acute enteritis. This may be infectious or ischemic in nature. Less likely neoplastic process. Clinical correlation is necessary. Less likely incarcerated internal hernia but not excluded go. No evidence of small bowel obstruction. 2. Small free fluid noted within pelvis. 3. No evidence of acute pancreatitis. 4. No pericecal inflammation. 5. Extensive atherosclerotic vascular calcifications as described above. 6. No hydronephrosis or hydroureter.  Assessment/Plan: (ICD-10's: 86D68.32,  28K92.2) 75 y.o. male with acute segmental enteritis attributable to Coumadin-induced coagulopathy with INR of >10 (immeasurable), complicated by acute superimposed on chronic renal insufficiency, recent history 2 weeks ago of acute pancreatitis with gallbladder sludge and [facility]odenitis [facility], and pertinent comorbidities including atrial fibrillation on chronic Coumadin anticoagulation without history of embolic  event (stroke, etc), DM, HTN, HLD, CAD, and GERD.   - reverse Coumadin-induced coagulopathy   - trend serial hemoglobins and transfuse as needed   - NPO, IVF for now, and monitor abdominal exam and bowel function  - if symptoms of bowel obstruction, may consider NGT following reversal of coagulopathy  - will follow along with primary medical team  - DVT prophylaxis after INR reversed  All of the above findings and recommendations were discussed with the patient, his wife, his ED RN, and his medical physician, and all of patient's and his family's questions were answered to their expressed satisfaction.  Thank you for the opportunity to participate in this patient's care.   -- Scherrie Gerlach Earlene Plater, MD, RPVI [place]: [facility] Office: [phone]

## 2016-03-18 NOTE — ED Notes (Signed)
CRITICAL VALUE ALERT  Critical value received:INR > 10 Date of notification:  03/18/16  Time of notification:  1546  Critical value read back:Yes.    Nurse who received alert:  Gertha CalkinMeagan Usiel Astarita,rn   MD notified (1st page):  Dr. Earlene Plateravis and Dr. Ardyth HarpsHernandez

## 2016-03-18 NOTE — ED Provider Notes (Signed)
[facility]-EMERGENCY DEPT Provider Note   CSN: [id number] Arrival date & time: 03/18/16  1016 By signing my name below, I, Linus Galas, attest that this documentation has been prepared under the direction and in the presence of Loren Racer, MD. Electronically Signed: Linus Galas, ED Scribe. 03/18/16. 2:19 PM.  History   Chief Complaint Chief Complaint  Patient presents with  . Emesis   HPI HPI Comments: Johnathan Chavez is a 75 y.o. male who presents to the Emergency Department with a PMHx of atrial fibrillation, pancreatits complaining of worsening lower abdominal pain with several episodes of associated N/V that began last night. Pt also reports constipation and productive cough with small amounts of blood in his phlegm this morning.  Pt states his abdominal pain is exacerbated with coughing. He states he is passing gas. No fever or chills. No urinary symptoms. Pt last BM was 3 days ago.   Past Medical History:  Diagnosis Date  . A-fib (HCC)   . CAD (coronary artery disease)   . CKD (chronic kidney disease) stage 2, GFR 60-89 ml/min   . GERD (gastroesophageal reflux disease)   . HTN (hypertension)   . Hyperlipidemia   . 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 Gallbladder sludge 03/07/2016  . Small Gallstones 03/07/2016  . Atrial fibrillation (HCC) 03/05/2016  . GERD (gastroesophageal reflux disease) 03/05/2016  . Biliary acute pancreatitis 03/05/2016  . CKD (chronic kidney disease), stage III 03/05/2016  . CAD (coronary artery disease) 03/05/2016  . Normocytic anemia 03/05/2016  . LFT elevation 03/05/2016  . Diabetes mellitus type II, non insulin dependent (HCC) 03/05/2016  . Duodenitis 03/05/2016  . AKI (acute kidney injury) (HCC) 03/05/2016  . Elevated LFTs   . Renal insufficiency   . RUQ pain   . Constipation 12/16/2015  . Bleeding hemorrhoid 12/16/2015   Past Surgical History:  Procedure  Laterality Date  . APPENDECTOMY    . BACK SURGERY     X2  . CERVICAL DISC SURGERY    . COLONOSCOPY  04/2014   [facility]: moderate sized internal hemorrhoids, 2 tubular adenomas removed from colon. diverticulosis  . CORONARY ARTERY BYPASS GRAFT  2001  . ESOPHAGOGASTRODUODENOSCOPY  01/2015   Dr. Allena Katz: gastritis  . INGUINAL HERNIA REPAIR Left   . SHOULDER ARTHROSCOPY Right   . SIGMOIDOSCOPY  01/2015   Dr. Allena Katz: internal hemorrhoids  . TONSILLECTOMY      Home Medications    Prior to Admission medications   Medication Sig Start Date End Date Taking? Authorizing Provider  amLODipine (NORVASC) 10 MG tablet Take 10 mg by mouth daily. 12/08/15  Yes Historical Provider, MD  aspirin (GOODSENSE ASPIRIN) 325 MG tablet Take 325 mg by mouth daily.    Yes Historical Provider, MD  calcium-vitamin D (SM CALCIUM 500/VITAMIN D3) 500-400 MG-UNIT tablet Take 1 tablet by mouth daily.    Yes Historical Provider, MD  Cyanocobalamin (VITAMIN B 12 PO) Take 1,000 mg by mouth daily.   Yes Historical Provider, MD  escitalopram (LEXAPRO) 10 MG tablet Take 10 mg by mouth daily.   Yes Historical Provider, MD  ferrous sulfate 325 (65 FE) MG tablet Take 1 tablet (325 mg total) by mouth daily with breakfast. 03/07/16  Yes Clanford L Johnson, MD  gabapentin (NEURONTIN) 300 MG capsule Take 300 mg by mouth 2 (two) times daily.    Yes Historical Provider, MD  Ginkgo Biloba 60 MG  TABS Take 60 mg by mouth daily.   Yes Historical Provider, MD  glipiZIDE (GLUCOTROL) 10 MG tablet Take 10 mg by mouth 3 (three) times daily.   Yes Historical Provider, MD  Glucosamine-Chondroit-Vit C-Mn (GLUCOSAMINE 1500 COMPLEX PO) Take 1,500 mg by mouth daily.   Yes Historical Provider, MD  linaclotide (LINZESS) 72 MCG capsule Take 72 mcg by mouth daily as needed (hard stool).    Yes Historical Provider, MD  lisinopril (PRINIVIL,ZESTRIL) 20 MG tablet Take 20 mg by mouth daily.    Yes Historical Provider, MD  metFORMIN  (GLUCOPHAGE) 1000 MG tablet Take 1,000 mg by mouth 2 (two) times daily.   Yes Historical Provider, MD  Multiple Vitamin (MULTIVITAMIN WITH MINERALS) TABS tablet Take 1 tablet by mouth daily.   Yes Historical Provider, MD  Omega-3 Fatty Acids (FISH OIL) 1200 MG CAPS Take 1,200 mg by mouth daily.   Yes Historical Provider, MD  omeprazole (PRILOSEC) 20 MG capsule Take 20 mg by mouth daily.    Yes Historical Provider, MD  simvastatin (ZOCOR) 40 MG tablet Take 40 mg by mouth daily.    Yes Historical Provider, MD  traMADol (ULTRAM) 50 MG tablet Take 50 mg by mouth 2 (two) times daily as needed for moderate pain.  10/13/15  Yes Historical Provider, MD  ursodiol (ACTIGALL) 250 MG tablet Take 1 tablet (250 mg total) by mouth 2 (two) times daily. 03/07/16  Yes Clanford Cyndie MullL Johnson, MD  vitamin C (ASCORBIC ACID) 500 MG tablet Take 500 mg by mouth daily.    Yes Historical Provider, MD  warfarin (COUMADIN) 5 MG tablet Take 5 mg by mouth daily.    Yes Historical Provider, MD  FLUZONE HIGH-DOSE 0.5 ML SUSY TO BE ADMINISTERED BY PHARMACIST FOR IMMUNIZATION 02/19/16   Historical Provider, MD  PREVNAR 13 SUSP injection TO BE ADMINISTERED BY PHARMACIST FOR IMMUNIZATION 02/19/16   Historical Provider, MD   Family History Family History  Problem Relation Age of Onset  . Diabetes Mother   . Colon cancer Father     ?colon cancer, age early 8170   Social History Social History  Substance Use Topics  . Smoking status: Never Smoker  . Smokeless tobacco: Never Used  . Alcohol use No   Allergies   Codeine  Review of Systems Review of Systems  Constitutional: Negative for chills and fever.  HENT: Negative for nosebleeds.   Respiratory: Positive for cough. Negative for shortness of breath.   Cardiovascular: Negative for chest pain.  Gastrointestinal: Positive for constipation, nausea and vomiting. Negative for blood in stool and diarrhea.  Genitourinary: Negative for dysuria, flank pain and hematuria.    Musculoskeletal: Positive for back pain. Negative for arthralgias, myalgias, neck pain and neck stiffness.  Skin: Negative for rash and wound.  Neurological: Negative for dizziness, weakness, light-headedness, numbness and headaches.  All other systems reviewed and are negative.    Physical Exam Updated Vital Signs BP 118/73   Pulse 61   Temp 97.2 F (36.2 C) (Temporal)   Resp 18   Ht 5\' 5"  (1.651 m)   Wt 180 lb (81.6 kg)   SpO2 97%   BMI 29.95 kg/m   Physical Exam  Constitutional: He is oriented to person, place, and time. He appears well-developed and well-nourished. No distress.  HENT:  Head: Normocephalic and atraumatic.  Mouth/Throat: Oropharynx is clear and moist. No oropharyngeal exudate.  Eyes: EOM are normal. Pupils are equal, round, and reactive to light.  Neck: Normal range of motion. Neck supple.  Cardiovascular: Normal rate and regular rhythm.  Exam reveals no gallop and no friction rub.   No murmur heard. Pulmonary/Chest: Effort normal and breath sounds normal. No respiratory distress. He has no wheezes.  Abdominal: Soft. Bowel sounds are normal. There is tenderness (very tender to palpation in the right lower and left lower quadrants. mild diffuse tenderness). There is no rebound and no guarding.  Musculoskeletal: Normal range of motion. He exhibits no edema or tenderness.  No CVA tenderness bilaterally. No midline thoracic or lumbar tenderness. No lower extremity swelling, asymmetry or tenderness.  Neurological: He is alert and oriented to person, place, and time.  5/5 motor in all extremities. Sensation is fully intact.  Skin: Skin is warm and dry. Capillary refill takes less than 2 seconds. No rash noted. No erythema.  Psychiatric: He has a normal mood and affect. His behavior is normal.  Nursing note and vitals reviewed.   ED Treatments / Results  Labs (all labs ordered are listed, but only abnormal results are displayed) Labs Reviewed  COMPREHENSIVE  METABOLIC PANEL - Abnormal; Notable for the following:       Result Value   Glucose, Bld 211 (*)    BUN 29 (*)    Creatinine, Ser 1.77 (*)    GFR calc non Af Amer 36 (*)    GFR calc Af Amer 42 (*)    All other components within normal limits  CBC - Abnormal; Notable for the following:    WBC 13.2 (*)    RBC 3.70 (*)    Hemoglobin 11.0 (*)    HCT 33.8 (*)    All other components within normal limits  PROTIME-INR - Abnormal; Notable for the following:    Prothrombin Time >90.0 (*)    INR >10.00 (*)    All other components within normal limits  APTT - Abnormal; Notable for the following:    aPTT 108 (*)    All other components within normal limits  LIPASE, BLOOD  TROPONIN I  URINALYSIS, ROUTINE W REFLEX MICROSCOPIC (NOT AT [facility])  I-STAT CG4 LACTIC ACID, ED  I-STAT CG4 LACTIC ACID, ED  TYPE AND SCREEN   EKG  EKG Interpretation  Date/Time:  Friday March 18 2016 11:01:57 EST Ventricular Rate:  60 PR Interval:    QRS Duration: 89 QT Interval:  422 QTC Calculation: 422 R Axis:   -11 Text Interpretation:  Atrial fibrillation Low voltage, precordial leads Baseline wander in lead(s) V5 Confirmed by Naila Elizondo  MD, Ariely Riddell ([id number]) on 03/18/2016 12:09:27 PM      Radiology Ct Abdomen Pelvis W Contrast  Result Date: 03/18/2016 CLINICAL DATA:  Abdominal pain, type 2 diabetes, chronic kidney disease, coughing up blood EXAM: CT ABDOMEN AND PELVIS WITH CONTRAST TECHNIQUE: Multidetector CT imaging of the abdomen and pelvis was performed using the standard protocol following bolus administration of intravenous contrast. CONTRAST:  30mL ISOVUE-300 IOPAMIDOL (ISOVUE-300) INJECTION 61%, 75mL ISOVUE-300 IOPAMIDOL (ISOVUE-300) INJECTION 61% COMPARISON:  None. FINDINGS: Lower chest: No acute abnormality Hepatobiliary: There is fatty infiltration of the liver. No focal hepatic mass. No calcified gallstones are noted within gallbladder. Pancreas: Enhanced pancreas shows no focal mass. No evidence of  acute pancreatitis. Spleen: Enhanced spleen is normal. Adrenals/Urinary Tract: Kidneys are symmetrical in size and enhancement. No hydronephrosis or hydroureter. Extensive vascular renal calcifications bilaterally again noted. The urinary bladder is unremarkable. Stomach/Bowel: No gastric outlet obstruction. There is significant segmental thickening of small bowel wall in mid pelvis axial image 66. There is stranding of adjacent mesentery  and probable mesenteric edema or inflammation. Findings are consistent with segmental acute enteritis. This may be infectious or ischemic in nature. Less likely neoplastic process. Clinical correlation is necessary. No pericecal inflammation. There is some free fluid in the upper pelvis just posterior to inflamed small bowel axial image 73. The fluid measures 39 Hounsfield units probable hemorrhagic fluid. Clinical correlation is necessary. Vascular/Lymphatic: Atherosclerotic calcifications of abdominal aorta and iliac arteries. No aortic aneurysm. Atherosclerotic calcifications are noted splenic artery celiac trunk SMA origin. Reproductive: Prostate gland and seminal vesicles are unremarkable. Other: Small free fluid within pelvis measures 22 Hounsfield units in attenuation probable hemorrhagic fluid. No evidence of free abdominal air. No evidence of bowel perforation. Musculoskeletal: No destructive bony lesions are noted. There is posterior fusion L4-L5 level. Significant disc space flattening with endplate sclerotic changes vacuum disc phenomenon at L3-L4 level. IMPRESSION: 1. There is significant segmental thickening of small bowel wall in mid pelvis axial image 66. There is stranding of adjacent mesentery and probable mesenteric edema or inflammation. Findings are consistent with segmental acute enteritis. This may be infectious or ischemic in nature. Less likely neoplastic process. Clinical correlation is necessary. Less likely incarcerated internal hernia but not excluded  go. No evidence of small bowel obstruction. 2. Small free fluid noted within pelvis. 3. No evidence of acute pancreatitis. 4. No pericecal inflammation. 5. Extensive atherosclerotic vascular calcifications as described above. 6. No hydronephrosis or hydroureter. These results were called by telephone at the time of interpretation on 03/18/2016 at 1:07 pm to Dr. Loren RacerAVID Ethlyn Alto , who verbally acknowledged these results. Electronically Signed   By: Natasha MeadLiviu  Pop M.D.   On: 03/18/2016 13:14   Dg Chest Port 1 View  Result Date: 03/18/2016 CLINICAL DATA:  Productive cough EXAM: PORTABLE CHEST 1 VIEW COMPARISON:  None. FINDINGS: Cardiac shadow is mildly enlarged. Postsurgical changes are noted. The overall inspiratory effort is poor although no focal infiltrate or sizable effusion is seen. No bony abnormality is noted. IMPRESSION: No active disease. Electronically Signed   By: Alcide CleverMark  Lukens M.D.   On: 03/18/2016 11:47    Procedures Procedures (including critical care time)  Medications Ordered in ED Medications  ondansetron (ZOFRAN) injection 4 mg (4 mg Intravenous Refused 03/18/16 1135)  prothrombin complex conc human (KCENTRA) IVPB 4,000 Units (not administered)  phytonadione (VITAMIN K) 10 mg in dextrose 5 % 50 mL IVPB (10 mg Intravenous New Bag/Given 03/18/16 1416)  sodium chloride 0.9 % bolus 500 mL (0 mLs Intravenous Stopped 03/18/16 1249)  iopamidol (ISOVUE-300) 61 % injection (30 mLs Oral Contrast Given 03/18/16 1145)  iopamidol (ISOVUE-300) 61 % injection 75 mL (75 mLs Intravenous Contrast Given 03/18/16 1229)  sodium chloride 0.9 % bolus 500 mL (500 mLs Intravenous New Bag/Given 03/18/16 1339)    Initial Impression / Assessment and Plan / ED Course  I have reviewed the triage vital signs and the nursing notes.  Pertinent labs & imaging results that were available during my care of the patient were reviewed by me and considered in my medical decision making (see chart for details). CRITICAL  CARE Performed by: Ranae PalmsYELVERTON, Lawson Mahone Total critical care time: 35 minutes Critical care time was exclusive of separately billable procedures and treating other patients. Critical care was necessary to treat or prevent imminent or life-threatening deterioration. Critical care was time spent personally by me on the following activities: development of treatment plan with patient and/or surrogate as well as nursing, discussions with consultants, evaluation of patient's response to treatment, examination of patient, obtaining  history from patient or surrogate, ordering and performing treatments and interventions, ordering and review of laboratory studies, ordering and review of radiographic studies, pulse oximetry and re-evaluation of patient's condition. Clinical Course    Radiologist called. Concern for intra-abdominal hemorrhage. Thickened wall segment of small bowel. Ischemic versus infectious. Discussed with Dr. Earlene Plater of general surgery. Agrees with Coumadin reversal. We'll see patient in the emergency department. Consult with Dr. Ardyth Harps. She'll also evaluate patient in the emergency department. Blood pressures improved after IV fluids. Patient did have one further episode of vomiting.  Final Clinical Impressions(s) / ED Diagnoses   Final diagnoses:  Lower abdominal pain  Elevated INR   New Prescriptions New Prescriptions   No medications on file   I personally performed the services described in this documentation, which was scribed in my presence. The recorded information has been reviewed and is accurate.      Loren Racer, MD 03/18/16 [phone]

## 2016-03-18 NOTE — ED Notes (Signed)
CRITICAL VALUE ALERT  Critical value received:  INR > 10  Date of notification: 03/18/16  Time of notification:  1202  Critical value read back:Yes.    Nurse who received alert:  Gertha CalkinMeagan Tyra Gural, rn   MD notified (1st page):  Ranae PalmsYelverton

## 2016-03-18 NOTE — ED Notes (Signed)
Pt returned from XR.  Pt placed back on cardiac monitor and given urinal to obtain sample.

## 2016-03-19 LAB — BASIC METABOLIC PANEL
Anion gap: 8 (ref 5–15)
BUN: 28 mg/dL — AB (ref 6–20)
CO2: 23 mmol/L (ref 22–32)
CREATININE: 1.6 mg/dL — AB (ref 0.61–1.24)
Calcium: 8.4 mg/dL — ABNORMAL LOW (ref 8.9–10.3)
Chloride: 107 mmol/L (ref 101–111)
GFR calc Af Amer: 47 mL/min — ABNORMAL LOW (ref >60)
GFR, EST NON AFRICAN AMERICAN: 41 mL/min — AB (ref >60)
GLUCOSE: 113 mg/dL — AB (ref 65–99)
POTASSIUM: 4.4 mmol/L (ref 3.5–5.1)
Sodium: 138 mmol/L (ref 135–145)

## 2016-03-19 LAB — CBC
HEMATOCRIT: 26.7 % — AB (ref 39.0–52.0)
Hemoglobin: 8.7 g/dL — ABNORMAL LOW (ref 13.0–17.0)
MCH: 29.5 pg (ref 26.0–34.0)
MCHC: 32.6 g/dL (ref 30.0–36.0)
MCV: 90.5 fL (ref 78.0–100.0)
PLATELETS: 314 10*3/uL (ref 150–400)
RBC: 2.95 MIL/uL — ABNORMAL LOW (ref 4.22–5.81)
RDW: 14.2 % (ref 11.5–15.5)
WBC: 9.7 10*3/uL (ref 4.0–10.5)

## 2016-03-19 LAB — GLUCOSE, CAPILLARY
GLUCOSE-CAPILLARY: 166 mg/dL — AB (ref 65–99)
GLUCOSE-CAPILLARY: 76 mg/dL (ref 65–99)
Glucose-Capillary: 92 mg/dL (ref 65–99)
Glucose-Capillary: 96 mg/dL (ref 65–99)

## 2016-03-19 LAB — PROTIME-INR
INR: 1.05
PROTHROMBIN TIME: 13.7 s (ref 11.4–15.2)

## 2016-03-19 NOTE — Progress Notes (Signed)
Informed by RN that patient has not received plasma because of self-reported reaction of hives and itching. MD E-Paged.

## 2016-03-19 NOTE — Progress Notes (Signed)
PROGRESS NOTE    Johnathan Chavez  ZHY:[id number] DOB: 01/02/1941 DOA: 03/18/2016 PCP: Isabella Bowens, MD     Brief Narrative:  75 y/o man admitted from home on 11/24 with c/o abdominal pain. Found to have enteritis and a supratherapeutic INR.   Assessment & Plan:   Principal Problem:   Supratherapeutic INR Active Problems:   Enteritis   Atrial fibrillation (HCC)   CKD (chronic kidney disease), stage III   CAD (coronary artery disease)   Diabetes mellitus type II, non insulin dependent (HCC)   Coagulopathy (HCC)   Enteritis -Unsure of etiology, altho pain is improved today. Less guarding on abdominal exam. -Will continue antibiotics, advance diet today.  Coagulopathy -INR >10 on admission due to warfarin. -Given vit K with INR down to 1. -FFP ordered but never received as patient repost an "allergy" to it.  A FIB -Rate controlled  CKD Stage III -At baseline  DM II -Fair control.   DVT prophylaxis: SCDs Code Status: full code Family Communication: patient only Disposition Plan: hope for home in 1-2 days  Consultants:   Surgery  Procedures:   None  Antimicrobials:  Anti-infectives    Start     Dose/Rate Route Frequency Ordered Stop   03/18/16 1800  piperacillin-tazobactam (ZOSYN) IVPB 3.375 g     3.375 g 12.5 mL/hr over 240 Minutes Intravenous Every 8 hours 03/18/16 1716         Subjective: Feels better, is hungry and would like diet advanced  Objective: Vitals:   03/18/16 1622 03/18/16 1645 03/18/16 2152 03/19/16 0440  BP:  120/61 (!) 79/42 104/61  Pulse:  67 77 83  Resp:  18 20 20   Temp: 98.8 F (37.1 C) 98.4 F (36.9 C) 98.3 F (36.8 C) 98.2 F (36.8 C)  TempSrc: Oral Oral Oral Oral  SpO2:  97% 99% 96%  Weight:  82.1 kg (180 lb 14.4 oz)    Height:  5\' 5"  (1.651 m)      Intake/Output Summary (Last 24 hours) at 03/19/16 1418 Last data filed at 03/19/16 1217  Gross per 24 hour  Intake            797.5 ml  Output               340 ml  Net            457.5 ml   Filed Weights   03/18/16 1028 03/18/16 1645  Weight: 81.6 kg (180 lb) 82.1 kg (180 lb 14.4 oz)    Examination:  General exam: Alert, awake, oriented x 3 Respiratory system: Clear to auscultation. Respiratory effort normal. Cardiovascular system:RRR. No murmurs, rubs, gallops. Gastrointestinal system: Abdomen is slightly TTP bilateral lower quadrants, improved from yesterday with less guarding Central nervous system: Alert and oriented. No focal neurological deficits. Extremities: No C/C/E, +pedal pulses Skin: No rashes, lesions or ulcers Psychiatry: Judgement and insight appear normal. Mood & affect appropriate.     Data Reviewed: I have personally reviewed following labs and imaging studies  CBC:  Recent Labs Lab 03/18/16 1035 03/18/16 1430 03/19/16 0459  WBC 13.2*  --  9.7  HGB 11.0* 11.6* 8.7*  HCT 33.8* 35.0* 26.7*  MCV 91.4  --  90.5  PLT 362  --  314   Basic Metabolic Panel:  Recent Labs Lab 03/18/16 1035 03/19/16 0459  NA 136 138  K 4.6 4.4  CL 104 107  CO2 24 23  GLUCOSE 211* 113*  BUN 29* 28*  CREATININE 1.77*  1.60*  CALCIUM 9.3 8.4*   GFR: Estimated Creatinine Clearance: 39.9 mL/min (by C-G formula based on SCr of 1.6 mg/dL (H)). Liver Function Tests:  Recent Labs Lab 03/18/16 1035  AST 17  ALT 27  ALKPHOS 102  BILITOT 0.6  PROT 6.9  ALBUMIN 3.6    Recent Labs Lab 03/18/16 1035  LIPASE 21   No results for input(s): AMMONIA in the last 168 hours. Coagulation Profile:  Recent Labs Lab 03/18/16 1055 03/18/16 1403 03/19/16 0856  INR >10.00* >10.00* 1.05   Cardiac Enzymes:  Recent Labs Lab 03/18/16 1055  TROPONINI <0.03   BNP (last 3 results) No results for input(s): PROBNP in the last 8760 hours. HbA1C: No results for input(s): HGBA1C in the last 72 hours. CBG:  Recent Labs Lab 03/18/16 2158 03/19/16 0723 03/19/16 1126  GLUCAP 150* 92 96   Lipid Profile: No results  for input(s): CHOL, HDL, LDLCALC, TRIG, CHOLHDL, LDLDIRECT in the last 72 hours. Thyroid Function Tests: No results for input(s): TSH, T4TOTAL, FREET4, T3FREE, THYROIDAB in the last 72 hours. Anemia Panel: No results for input(s): VITAMINB12, FOLATE, FERRITIN, TIBC, IRON, RETICCTPCT in the last 72 hours. Urine analysis:    Component Value Date/Time   COLORURINE YELLOW 03/18/2016 1435   APPEARANCEUR CLEAR 03/18/2016 1435   LABSPEC 1.020 03/18/2016 1435   PHURINE 5.0 03/18/2016 1435   GLUCOSEU NEGATIVE 03/18/2016 1435   HGBUR NEGATIVE 03/18/2016 1435   BILIRUBINUR NEGATIVE 03/18/2016 1435   KETONESUR NEGATIVE 03/18/2016 1435   PROTEINUR NEGATIVE 03/18/2016 1435   NITRITE NEGATIVE 03/18/2016 1435   LEUKOCYTESUR NEGATIVE 03/18/2016 1435   Sepsis Labs: @LABRCNTIP (procalcitonin:4,lacticidven:4)  )No results found for this or any previous visit (from the past 240 hour(s)).       Radiology Studies: Ct Abdomen Pelvis W Contrast  Result Date: 03/18/2016 CLINICAL DATA:  Abdominal pain, type 2 diabetes, chronic kidney disease, coughing up blood EXAM: CT ABDOMEN AND PELVIS WITH CONTRAST TECHNIQUE: Multidetector CT imaging of the abdomen and pelvis was performed using the standard protocol following bolus administration of intravenous contrast. CONTRAST:  30mL ISOVUE-300 IOPAMIDOL (ISOVUE-300) INJECTION 61%, 75mL ISOVUE-300 IOPAMIDOL (ISOVUE-300) INJECTION 61% COMPARISON:  None. FINDINGS: Lower chest: No acute abnormality Hepatobiliary: There is fatty infiltration of the liver. No focal hepatic mass. No calcified gallstones are noted within gallbladder. Pancreas: Enhanced pancreas shows no focal mass. No evidence of acute pancreatitis. Spleen: Enhanced spleen is normal. Adrenals/Urinary Tract: Kidneys are symmetrical in size and enhancement. No hydronephrosis or hydroureter. Extensive vascular renal calcifications bilaterally again noted. The urinary bladder is unremarkable. Stomach/Bowel: No  gastric outlet obstruction. There is significant segmental thickening of small bowel wall in mid pelvis axial image 66. There is stranding of adjacent mesentery and probable mesenteric edema or inflammation. Findings are consistent with segmental acute enteritis. This may be infectious or ischemic in nature. Less likely neoplastic process. Clinical correlation is necessary. No pericecal inflammation. There is some free fluid in the upper pelvis just posterior to inflamed small bowel axial image 73. The fluid measures 39 Hounsfield units probable hemorrhagic fluid. Clinical correlation is necessary. Vascular/Lymphatic: Atherosclerotic calcifications of abdominal aorta and iliac arteries. No aortic aneurysm. Atherosclerotic calcifications are noted splenic artery celiac trunk SMA origin. Reproductive: Prostate gland and seminal vesicles are unremarkable. Other: Small free fluid within pelvis measures 22 Hounsfield units in attenuation probable hemorrhagic fluid. No evidence of free abdominal air. No evidence of bowel perforation. Musculoskeletal: No destructive bony lesions are noted. There is posterior fusion L4-L5 level. Significant disc space flattening  with endplate sclerotic changes vacuum disc phenomenon at L3-L4 level. IMPRESSION: 1. There is significant segmental thickening of small bowel wall in mid pelvis axial image 66. There is stranding of adjacent mesentery and probable mesenteric edema or inflammation. Findings are consistent with segmental acute enteritis. This may be infectious or ischemic in nature. Less likely neoplastic process. Clinical correlation is necessary. Less likely incarcerated internal hernia but not excluded go. No evidence of small bowel obstruction. 2. Small free fluid noted within pelvis. 3. No evidence of acute pancreatitis. 4. No pericecal inflammation. 5. Extensive atherosclerotic vascular calcifications as described above. 6. No hydronephrosis or hydroureter. These results were  called by telephone at the time of interpretation on 03/18/2016 at 1:07 pm to Dr. Loren RacerAVID YELVERTON , who verbally acknowledged these results. Electronically Signed   By: Natasha MeadLiviu  Pop M.D.   On: 03/18/2016 13:14   Dg Chest Port 1 View  Result Date: 03/18/2016 CLINICAL DATA:  Productive cough EXAM: PORTABLE CHEST 1 VIEW COMPARISON:  None. FINDINGS: Cardiac shadow is mildly enlarged. Postsurgical changes are noted. The overall inspiratory effort is poor although no focal infiltrate or sizable effusion is seen. No bony abnormality is noted. IMPRESSION: No active disease. Electronically Signed   By: Alcide CleverMark  Lukens M.D.   On: 03/18/2016 11:47        Scheduled Meds: . sodium chloride   Intravenous Once  . insulin aspart  0-5 Units Subcutaneous QHS  . insulin aspart  0-9 Units Subcutaneous TID WC  . piperacillin-tazobactam (ZOSYN)  IV  3.375 g Intravenous Q8H   Continuous Infusions: . sodium chloride 75 mL/hr at 03/18/16 1757     LOS: 1 day    Time spent: 25 minutes. Greater than 50% of this time was spent in direct contact with the patient coordinating care.     Chaya JanHERNANDEZ ACOSTA,ESTELA, MD [facility] Pager [phone]  If 7PM-7AM, please contact night-coverage [email or web address] Password [email or web address] 03/19/2016, 2:18 PM

## 2016-03-19 NOTE — Progress Notes (Signed)
SURGICAL PROGRESS NOTE (cpt [phone])  Hospital Day(s): 1.   Post op day(s):  Marland Kitchen.   Interval History: Patient seen and examined, no acute events or new complaints overnight. Patient reports he feels much better with much improved abdominal pain (though not fully resolved) and flatus, denies fever/chills, N/V, CP, or SOB with baseline energy level. Patient and his wife ask about non-Coumadin alternatives for anticoagulation.  Review of Systems:  Constitutional: denies fever, chills  HEENT: denies cough or congestion  Respiratory: denies any shortness of breath  Cardiovascular: denies chest pain or palpitations  Gastrointestinal: abdominal pain, N/V, and bowel function as per interval history Genitourinary: denies burning with urination or urinary frequency Musculoskeletal: denies pain, decreased motor or sensation Integumentary: denies any other rashes or skin discolorations Neurological: denies HA or vision/hearing changes   Vital signs in last 24 hours: [min-max] current  Temp:  [98.2 F (36.8 C)-98.8 F (37.1 C)] 98.2 F (36.8 C) (11/25 0440) Pulse Rate:  [56-101] 83 (11/25 0440) Resp:  [17-23] 20 (11/25 0440) BP: (79-133)/(42-73) 104/61 (11/25 0440) SpO2:  [94 %-100 %] 96 % (11/25 0440) Weight:  [82.1 kg (180 lb 14.4 oz)] 82.1 kg (180 lb 14.4 oz) (11/24 1645)     Height: 5\' 5"  (165.1 cm) Weight: 82.1 kg (180 lb 14.4 oz) BMI (Calculated): 30.2   Intake/Output this shift:  Total I/O In: -  Out: 240 [Urine:240]   Intake/Output last 2 shifts:  @IOLAST2SHIFTS @   Physical Exam:  Constitutional: alert, cooperative and no distress  HENT: normocephalic without obvious abnormality  Eyes: PERRL, EOM's grossly intact and symmetric  Neuro: CN II - XII grossly intact and symmetric without deficit  Respiratory: breathing non-labored at rest  Cardiovascular: regular rate and sinus rhythm  Gastrointestinal: soft, mild-moderate lower midline abdominal pain, otherwise minimally tender to  palpation, ND  Musculoskeletal: UE and LE FROM, no edema or wounds, motor and sensation grossly intact, NT   Labs:  CBC:  Lab Results  Component Value Date   WBC 9.7 03/19/2016   RBC 2.95 (L) 03/19/2016   BMP:  Lab Results  Component Value Date   GLUCOSE 113 (H) 03/19/2016   CO2 23 03/19/2016   BUN 28 (H) 03/19/2016   CREATININE 1.60 (H) 03/19/2016   CREATININE 1.44 (H) 03/11/2016   CALCIUM 8.4 (L) 03/19/2016   Coagulation:  Lab Results  Component Value Date   INR 1.05 03/19/2016   APTT 108 (H) 03/18/2016     Imaging studies: No new pertinent imaging studies   Assessment/Plan: (ICD-10's: D68.32, K92.2) 75 y.o. male with acute segmental enteritis attributable to now-resolved Coumadin-induced coagulopathy with presenting INR of >10 (immeasurable), less likely infectious or ischemic enteritis, complicated by acute superimposed on chronic renal insufficiency, recent history 2 weeks ago of acute pancreatitis with gallbladder sludge and [facility] [place], and pertinent comorbidities including atrial fibrillation on chronic Coumadin anticoagulation without history of embolic event (stroke, etc), DM, HTN, HLD, CAD, and GERD.   - okay with clear liquids diet             - trend serial hemoglobins and transfuse as needed              - continue to monitor abdominal exam and bowel function             - medical management of co-morbidities as per medical team  - consider anti-factor Xa inhibitor for future anticoagulation if renal function permits  - DVT prophylaxis as per primary  team, ambulation encouraged  All of the above findings and recommendations were discussed with the patient, his wife, his ED RN, and his medical physician, and all of patient's and his family's questions were answered to their expressed satisfaction.  Thank you for the opportunity to participate in this patient's care.   -- Scherrie GerlachJason E. Earlene Plateravis, MD, RPVI [place]: [facility] Office: [phone]

## 2016-03-19 NOTE — Progress Notes (Signed)
Patient stated he had a reaction approximately 10 years ago to plasma.  States he had hives and itching.  Message sent to mid-level with no response.   Message sent to Dr. Antionette Charpyd with no response as of the writing of this note.  Plasma was not given. Plasma product returned to lab with in 10 minutes of receiving the product.

## 2016-03-20 LAB — BASIC METABOLIC PANEL
Anion gap: 9 (ref 5–15)
BUN: 22 mg/dL — AB (ref 6–20)
CALCIUM: 8.8 mg/dL — AB (ref 8.9–10.3)
CO2: 23 mmol/L (ref 22–32)
CREATININE: 1.63 mg/dL — AB (ref 0.61–1.24)
Chloride: 106 mmol/L (ref 101–111)
GFR calc Af Amer: 46 mL/min — ABNORMAL LOW (ref >60)
GFR, EST NON AFRICAN AMERICAN: 40 mL/min — AB (ref >60)
GLUCOSE: 98 mg/dL (ref 65–99)
Potassium: 4.2 mmol/L (ref 3.5–5.1)
Sodium: 138 mmol/L (ref 135–145)

## 2016-03-20 LAB — PROTIME-INR
INR: 1.29
Prothrombin Time: 16.2 seconds — ABNORMAL HIGH (ref 11.4–15.2)

## 2016-03-20 LAB — CBC
HEMATOCRIT: 27.7 % — AB (ref 39.0–52.0)
Hemoglobin: 9.1 g/dL — ABNORMAL LOW (ref 13.0–17.0)
MCH: 29.8 pg (ref 26.0–34.0)
MCHC: 32.9 g/dL (ref 30.0–36.0)
MCV: 90.8 fL (ref 78.0–100.0)
PLATELETS: 309 10*3/uL (ref 150–400)
RBC: 3.05 MIL/uL — ABNORMAL LOW (ref 4.22–5.81)
RDW: 14.3 % (ref 11.5–15.5)
WBC: 8 10*3/uL (ref 4.0–10.5)

## 2016-03-20 LAB — GLUCOSE, CAPILLARY
GLUCOSE-CAPILLARY: 125 mg/dL — AB (ref 65–99)
Glucose-Capillary: 97 mg/dL (ref 65–99)

## 2016-03-20 LAB — HEMOGLOBIN A1C
Hgb A1c MFr Bld: 6.4 % — ABNORMAL HIGH (ref 4.8–5.6)
MEAN PLASMA GLUCOSE: 137 mg/dL

## 2016-03-20 MED ORDER — METRONIDAZOLE 500 MG PO TABS: 500.0000 mg | ORAL_TABLET | Freq: Three times a day (TID) | ORAL | 0 refills | Status: DC

## 2016-03-20 MED ORDER — CIPROFLOXACIN HCL 500 MG PO TABS: 500.0000 mg | ORAL_TABLET | Freq: Two times a day (BID) | ORAL | 0 refills | Status: DC

## 2016-03-20 NOTE — Progress Notes (Signed)
SURGICAL PROGRESS NOTE (cpt [phone])  Hospital Day(s): 2.   Post op day(s):  Marland Kitchen.   Interval History: Patient seen and examined, no acute events or new complaints overnight. Patient reports his abdominal pain continues to improve and is currently mild. He has been tolerating a regular diet, is passing flatus and ambulating without difficulty, and denies fever/chills, N/V, CP, or SOB.  Review of Systems:  Constitutional: denies fever, chills  HEENT: denies cough or congestion  Respiratory: denies any shortness of breath  Cardiovascular: denies chest pain or palpitations  Gastrointestinal: abdominal pain and bowel function as per interval history, denies N/V Genitourinary: denies burning with urination or urinary frequency Musculoskeletal: denies pain, decreased motor or sensation Integumentary: denies any other rashes or skin discolorations Neurological: denies HA or vision/hearing changes   Vital signs in last 24 hours: [min-max] current  Temp:  [98.1 F (36.7 C)-98.8 F (37.1 C)] 98.8 F (37.1 C) (11/26 0530) Pulse Rate:  [53-71] 53 (11/26 0530) Resp:  [18] 18 (11/25 2215) BP: (92-116)/(45-62) 92/45 (11/26 0530) SpO2:  [93 %-96 %] 93 % (11/25 2215)     Height: 5\' 5"  (165.1 cm) Weight: 82.1 kg (180 lb 14.4 oz) BMI (Calculated): 30.2   Intake/Output this shift:  Total I/O In: 240 [P.O.:240] Out: 400 [Urine:400]   Intake/Output last 2 shifts:  @IOLAST2SHIFTS @   Physical Exam:  Constitutional: alert, cooperative and no distress  HENT: normocephalic without obvious abnormality  Eyes: PERRL, EOM's grossly intact and symmetric  Neuro: CN II - XII grossly intact and symmetric without deficit  Respiratory: breathing non-labored at rest  Cardiovascular: regular rate and sinus rhythm  Gastrointestinal: soft, minimal upper abdominal tenderness to deep palpation, non-distended  Musculoskeletal: UE and LE FROM, no edema or wounds, motor and sensation grossly intact, NT   Labs:  CBC:   Lab Results  Component Value Date   WBC 8.0 03/20/2016   RBC 3.05 (L) 03/20/2016   BMP:  Lab Results  Component Value Date   GLUCOSE 98 03/20/2016   CO2 23 03/20/2016   BUN 22 (H) 03/20/2016   CREATININE 1.63 (H) 03/20/2016   CREATININE 1.44 (H) 03/11/2016   CALCIUM 8.8 (L) 03/20/2016     Imaging studies: No new pertinent imaging studies   Assessment/Plan: (ICD-10's: D68.32, K92.2) 75 y.o.malewith acute segmental enteritis attributable to now-resolved Coumadin-induced coagulopathy with presenting INR of >10 (immeasurable), less likely infectious or ischemic enteritis, complicated by acute superimposed on chronic renal insufficiency, recent history 2 weeks ago of acute pancreatitis with gallbladder sludge and [facility]odenitis [facility] [place],and pertinent comorbidities including atrial fibrillation on chronic Coumadin anticoagulation without history of embolic event (stroke, etc), DM, HTN, HLD, CAD, and GERD.              - continue diet as tolerated, slowly advance - medical management of co-morbidities as per medical team             - consider anti-factor Xa inhibitor for future anticoagulation if renal function permits             - DVT prophylaxis as per primary team, ambulation encouraged  - okay with discharge home as per primary team  All of the above findings and recommendations were discussed with the patient,his wife, his medical physician, and all of patient's and his family's questions were answered to their expressed satisfaction.  Thank you for the opportunity to participate in this patient's care.   -- Scherrie GerlachJason E. Earlene Plateravis, MD, RPVI [place]: [facility] Office: [phone]

## 2016-03-20 NOTE — Discharge Summary (Signed)
Physician Discharge Summary  Johnathan KannerWilliam R Binns QIO:[id number] DOB: [birth year] DOA: 03/18/2016  PCP: Isabella BowensVASIREDDY,VENUGOPAL KIRAN, MD  Admit date: 03/18/2016 Discharge date: 03/20/2016  Time spent: 45 minutes  Recommendations for Outpatient Follow-up:  -Will be discharged home today. -Will need to complete a 7 day course course of cipro/flagyl. -Has been advised to stop coumadin for the next 7-10 days and to follow up with cardiologist within that time frame to discuss other anticoagulation options that might be easier to monitor.  Discharge Diagnoses:  Principal Problem:   Supratherapeutic INR Active Problems:   Enteritis   Atrial fibrillation (HCC)   CKD (chronic kidney disease), stage III   CAD (coronary artery disease)   Diabetes mellitus type II, non insulin dependent (HCC)   Coagulopathy (HCC)   Discharge Condition: Stable and improved  Filed Weights   03/18/16 1028 03/18/16 1645  Weight: 81.6 kg (180 lb) 82.1 kg (180 lb 14.4 oz)    History of present illness:  Johnathan Chavez is a 75 y.o. male with h/o pancreatitis, a fib, DM II, CAD and CKD Stage II, here today with above symptoms that began about 12 hours prior to presentation. He has recently had 2 hospitalizations at outside facilities due to acute pancreatitis and thought it had flared up again. His lower abdomen has been exquisitely tender, hurts when walking or coughing. Work up in the ED shows and INR >10, WBCs 13.2, Cr 1.77 (baseline 1.44). Ct abdomen with small bowel wall thickening c/w segmental enteritis. CT also makes a mention of some free fluid posterior to the inflamed bowel that based on density probably represents hemorrhagic fluid. Given concerns for acute abdomen surgical consultation was requested by EDP. They recommend reversal of INR and watchful waiting for now. Admission has been requested for further evaluation and management.  Hospital Course:   Enteritis -Unsure of etiology,infectious vs  inflammatory vs bleeding into bowel wall due to supratherapeutic INR. -Pain is improving and he is tolerating a solid diet without issues. -Transition cipro/flagyl to PO with plans to continue for 7 days.  Coagulopathy -INR >10 on admission due to warfarin. -Given vit K with INR down to 1. -FFP ordered but never received as patient reported an "allergy" to it. -Have advised holding anticoagulation for 7-10 days given finding on CT scan of hemorrhagic fluid in bowel wall. -He will follow up with cardiologist for a discussion about better anticoagulation options that don't have such a narrow therapeutic window as warfarin.  A FIB -Rate controlled  CKD Stage III -At baseline  DM II -Fair control.   Procedures:  None   Consultations:  Surgery, Dr. Earlene Plateravis  Discharge Instructions  Discharge Instructions    Diet - low sodium heart healthy    Complete by:  As directed    Increase activity slowly    Complete by:  As directed        Medication List    STOP taking these medications   GOODSENSE ASPIRIN 325 MG tablet Generic drug:  aspirin   warfarin 5 MG tablet Commonly known as:  COUMADIN     TAKE these medications   amLODipine 10 MG tablet Commonly known as:  NORVASC Take 10 mg by mouth daily.   ciprofloxacin 500 MG tablet Commonly known as:  CIPRO Take 1 tablet (500 mg total) by mouth 2 (two) times daily.   escitalopram 10 MG tablet Commonly known as:  LEXAPRO Take 10 mg by mouth daily.   ferrous sulfate 325 (65 FE) MG  tablet Take 1 tablet (325 mg total) by mouth daily with breakfast.   Fish Oil 1200 MG Caps Take 1,200 mg by mouth daily.   FLUZONE HIGH-DOSE 0.5 ML Susy Generic drug:  Influenza Vac Split High-Dose TO BE ADMINISTERED BY PHARMACIST FOR IMMUNIZATION   gabapentin 300 MG capsule Commonly known as:  NEURONTIN Take 300 mg by mouth 2 (two) times daily.   Ginkgo Biloba 60 MG Tabs Take 60 mg by mouth daily.   glipiZIDE 10 MG  tablet Commonly known as:  GLUCOTROL Take 10 mg by mouth 3 (three) times daily.   GLUCOSAMINE 1500 COMPLEX PO Take 1,500 mg by mouth daily.   LINZESS 72 MCG capsule Generic drug:  linaclotide Take 72 mcg by mouth daily as needed (hard stool).   lisinopril 20 MG tablet Commonly known as:  PRINIVIL,ZESTRIL Take 20 mg by mouth daily.   metFORMIN 1000 MG tablet Commonly known as:  GLUCOPHAGE Take 1,000 mg by mouth 2 (two) times daily.   metroNIDAZOLE 500 MG tablet Commonly known as:  FLAGYL Take 1 tablet (500 mg total) by mouth 3 (three) times daily.   multivitamin with minerals Tabs tablet Take 1 tablet by mouth daily.   omeprazole 20 MG capsule Commonly known as:  PRILOSEC Take 20 mg by mouth daily.   PREVNAR 13 Susp injection Generic drug:  pneumococcal 13-valent conjugate vaccine TO BE ADMINISTERED BY PHARMACIST FOR IMMUNIZATION   simvastatin 40 MG tablet Commonly known as:  ZOCOR Take 40 mg by mouth daily.   SM CALCIUM 500/VITAMIN D3 500-400 MG-UNIT tablet Generic drug:  calcium-vitamin D Take 1 tablet by mouth daily.   traMADol 50 MG tablet Commonly known as:  ULTRAM Take 50 mg by mouth 2 (two) times daily as needed for moderate pain.   ursodiol 250 MG tablet Commonly known as:  ACTIGALL Take 1 tablet (250 mg total) by mouth 2 (two) times daily.   VITAMIN B 12 PO Take 1,000 mg by mouth daily.   vitamin C 500 MG tablet Commonly known as:  ASCORBIC ACID Take 500 mg by mouth daily.      Allergies  Allergen Reactions  . Codeine Nausea Only   Follow-up Information    Ancil Linsey, MD Follow up.   Specialty:  General Surgery Why:  No surgical follow-up appointment necessary, but please call if any surgical questions. Contact information: [address] [place] [facility] [address] [phone]        Isabella Bowens, MD. Schedule an appointment as soon as possible for a visit in 2 week(s).   Specialty:  Internal Medicine Contact  information: [address] [place] [address] [phone]            The results of significant diagnostics from this hospitalization (including imaging, microbiology, ancillary and laboratory) are listed below for reference.    Significant Diagnostic Studies: Ct Abdomen Pelvis Wo Contrast  Result Date: 03/05/2016 CLINICAL DATA:  Right abdominal and right flank pain. Elevated liver function studies. Recent episode of pancreatitis. Patient was seen at urgent care in [facility] and discharge from [facility] on 03/03/2016. EXAM: CT ABDOMEN AND PELVIS WITHOUT CONTRAST TECHNIQUE: Multidetector CT imaging of the abdomen and pelvis was performed following the standard protocol without IV contrast. COMPARISON:  None. FINDINGS: Lower chest: Small bilateral pleural effusions with basilar atelectasis, greater on the left. Postoperative changes in the mediastinum. Small esophageal hiatal hernia. Hepatobiliary: No focal liver abnormality is seen. No gallstones, gallbladder wall thickening, or biliary dilatation. Pancreas:  Mild infiltration in the fat around the pancreas consistent with mild acute pancreatitis. No pancreatic ductal dilatation. No loculated fluid collections. Spleen: Normal in size without focal abnormality. Adrenals/Urinary Tract: No adrenal gland nodules. Prominent vascular calcifications demonstrated in the renal sinus arteries bilaterally. No hydronephrosis or hydroureter. No renal, ureteral, or bladder stones. Bladder is decompressed without obvious bladder wall thickening. Prominent para renal fat bilaterally. Stomach/Bowel: Stomach is unremarkable. Small bowel are decompressed but there appears to be wall thickening in the duodenum suggesting duodenitis. Scattered stool throughout the colon without distention or apparent wall thickening. Appendix is not identified. Vascular/Lymphatic: Aortic atherosclerosis. No enlarged abdominal or pelvic lymph nodes. Extensive  vascular calcifications throughout the abdomen and pelvis. Reproductive: Prostate gland is enlarged, measuring 4.6 cm diameter. Other: No abdominal wall hernia or abnormality. No abdominopelvic ascites. Musculoskeletal: Postoperative and degenerative changes in the lumbar spine. No destructive bone lesions. IMPRESSION: Small bilateral pleural effusions with basilar atelectasis, greater on the left. Infiltration in the fat around the pancreas suggesting acute pancreatitis. Duodenal wall thickening suggesting duodenitis. No evidence of bowel obstruction. No renal or ureteral stone or obstruction. Diffuse vascular calcifications. Electronically Signed   By: Burman Nieves M.D.   On: 03/05/2016 00:15   Ct Abdomen Pelvis W Contrast  Result Date: 03/18/2016 CLINICAL DATA:  Abdominal pain, type 2 diabetes, chronic kidney disease, coughing up blood EXAM: CT ABDOMEN AND PELVIS WITH CONTRAST TECHNIQUE: Multidetector CT imaging of the abdomen and pelvis was performed using the standard protocol following bolus administration of intravenous contrast. CONTRAST:  30mL ISOVUE-300 IOPAMIDOL (ISOVUE-300) INJECTION 61%, 75mL ISOVUE-300 IOPAMIDOL (ISOVUE-300) INJECTION 61% COMPARISON:  None. FINDINGS: Lower chest: No acute abnormality Hepatobiliary: There is fatty infiltration of the liver. No focal hepatic mass. No calcified gallstones are noted within gallbladder. Pancreas: Enhanced pancreas shows no focal mass. No evidence of acute pancreatitis. Spleen: Enhanced spleen is normal. Adrenals/Urinary Tract: Kidneys are symmetrical in size and enhancement. No hydronephrosis or hydroureter. Extensive vascular renal calcifications bilaterally again noted. The urinary bladder is unremarkable. Stomach/Bowel: No gastric outlet obstruction. There is significant segmental thickening of small bowel wall in mid pelvis axial image 66. There is stranding of adjacent mesentery and probable mesenteric edema or inflammation. Findings are  consistent with segmental acute enteritis. This may be infectious or ischemic in nature. Less likely neoplastic process. Clinical correlation is necessary. No pericecal inflammation. There is some free fluid in the upper pelvis just posterior to inflamed small bowel axial image 73. The fluid measures 39 Hounsfield units probable hemorrhagic fluid. Clinical correlation is necessary. Vascular/Lymphatic: Atherosclerotic calcifications of abdominal aorta and iliac arteries. No aortic aneurysm. Atherosclerotic calcifications are noted splenic artery celiac trunk SMA origin. Reproductive: Prostate gland and seminal vesicles are unremarkable. Other: Small free fluid within pelvis measures 22 Hounsfield units in attenuation probable hemorrhagic fluid. No evidence of free abdominal air. No evidence of bowel perforation. Musculoskeletal: No destructive bony lesions are noted. There is posterior fusion L4-L5 level. Significant disc space flattening with endplate sclerotic changes vacuum disc phenomenon at L3-L4 level. IMPRESSION: 1. There is significant segmental thickening of small bowel wall in mid pelvis axial image 66. There is stranding of adjacent mesentery and probable mesenteric edema or inflammation. Findings are consistent with segmental acute enteritis. This may be infectious or ischemic in nature. Less likely neoplastic process. Clinical correlation is necessary. Less likely incarcerated internal hernia but not excluded go. No evidence of small bowel obstruction. 2. Small free fluid noted within pelvis. 3. No evidence of acute pancreatitis. 4.  No pericecal inflammation. 5. Extensive atherosclerotic vascular calcifications as described above. 6. No hydronephrosis or hydroureter. These results were called by telephone at the time of interpretation on 03/18/2016 at 1:07 pm to Dr. Loren Racer , who verbally acknowledged these results. Electronically Signed   By: Natasha Mead M.D.   On: 03/18/2016 13:14   Dg Chest  Port 1 View  Result Date: 03/18/2016 CLINICAL DATA:  Productive cough EXAM: PORTABLE CHEST 1 VIEW COMPARISON:  None. FINDINGS: Cardiac shadow is mildly enlarged. Postsurgical changes are noted. The overall inspiratory effort is poor although no focal infiltrate or sizable effusion is seen. No bony abnormality is noted. IMPRESSION: No active disease. Electronically Signed   By: Alcide Clever M.D.   On: 03/18/2016 11:47   Mr Abdomen Mrcp Vivien Rossetti Contast  Result Date: 03/06/2016 CLINICAL DATA:  Acute pancreatitis. EXAM: MRI ABDOMEN WITHOUT AND WITH CONTRAST (INCLUDING MRCP) TECHNIQUE: Multiplanar multisequence MR imaging of the abdomen was performed both before and after the administration of intravenous contrast. Heavily T2-weighted images of the biliary and pancreatic ducts were obtained, and three-dimensional MRCP images were rendered by post processing. CONTRAST:  18mL MULTIHANCE GADOBENATE DIMEGLUMINE 529 MG/ML IV SOLN COMPARISON:  Ultrasound from 03/05/2016 and CT abdomen pelvis from 03/04/2016. FINDINGS: Lower chest: Small to moderate bilateral pleural effusions identified. No pericardial effusion. Hepatobiliary: No focal liver abnormality identified. The gallbladder appears normal. No filling defects identified within the gallbladder lumen. No common bile duct or intrahepatic bile duct dilatation. There is no evidence for choledocholithiasis. Pancreas: Mild edema with surrounding fat stranding. No pancreatic duct dilatation and no fluid collections identified to suggest pseudocyst formation. No findings identified to suggest pancreatic necrosis secondary to pancreatitis. Spleen:  Within normal limits in size and appearance. Adrenals/Urinary Tract: No masses identified. No evidence of hydronephrosis. Similar appearance of chronic bilateral perinephric fat stranding. Stomach/Bowel: Stomach appears normal. There is wall thickening involving the second portion of the duodenal C-loop, image 31 of series 12. No  pathologic dilatation of the small or large bowel. Vascular/Lymphatic: No pathologically enlarged lymph nodes identified. No abdominal aortic aneurysm demonstrated. Other:  None. Musculoskeletal: No suspicious bone lesions identified. IMPRESSION: 1. Mild pancreatic edema compatible with the clinical history of pancreatitis. No complications of pancreatitis identified. No significant pancreatic duct dilatation, pancreatic necrosis or pseudocyst. 2. Wall thickening involving the descending portion of the duodenum which may reflect secondary inflammatory changes versus primary duodenitis. 3. Normal appearance of the gallbladder. No gallstones or evidence of choledocholithiasis. No biliary dilatation. 4. Electronically Signed   By: Signa Kell M.D.   On: 03/06/2016 15:11   US Abdomen Limited Ruq  Result Date: 03/05/2016 CLINICAL DATA:  75 y/o M; right upper quadrant pain for 1 day with transaminitis. EXAM: US ABDOMEN LIMITED - RIGHT UPPER QUADRANT COMPARISON:  02/03/2016 CT of the abdomen and pelvis. FINDINGS: Gallbladder: No gallstones or wall thickening visualized. No sonographic Murphy sign noted by sonographer. Echogenic non shadowing material within the dependent gallbladder is compatible with sludge or tiny stones. Common bile duct: Diameter: 2.4 mm Liver: No focal lesion identified. Within normal limits in parenchymal echogenicity. Trace right pleural effusion. IMPRESSION: Gallbladder sludge or tiny stones. No secondary signs of cholecystitis. No common bile duct dilatation. Trace right pleural effusion. Electronically Signed   By: Mitzi Hansen M.D.   On: 03/05/2016 02:12    Microbiology: No results found for this or any previous visit (from the past 240 hour(s)).   Labs: Basic Metabolic Panel:  Recent Labs Lab 03/18/16  1035 03/19/16 0459 03/20/16 0551  NA 136 138 138  K 4.6 4.4 4.2  CL 104 107 106  CO2 24 23 23   GLUCOSE 211* 113* 98  BUN 29* 28* 22*  CREATININE 1.77* 1.60*  1.63*  CALCIUM 9.3 8.4* 8.8*   Liver Function Tests:  Recent Labs Lab 03/18/16 1035  AST 17  ALT 27  ALKPHOS 102  BILITOT 0.6  PROT 6.9  ALBUMIN 3.6    Recent Labs Lab 03/18/16 1035  LIPASE 21   No results for input(s): AMMONIA in the last 168 hours. CBC:  Recent Labs Lab 03/18/16 1035 03/18/16 1430 03/19/16 0459 03/20/16 0551  WBC 13.2*  --  9.7 8.0  HGB 11.0* 11.6* 8.7* 9.1*  HCT 33.8* 35.0* 26.7* 27.7*  MCV 91.4  --  90.5 90.8  PLT 362  --  314 309   Cardiac Enzymes:  Recent Labs Lab 03/18/16 1055  TROPONINI <0.03   BNP: BNP (last 3 results) No results for input(s): BNP in the last 8760 hours.  ProBNP (last 3 results) No results for input(s): PROBNP in the last 8760 hours.  CBG:  Recent Labs Lab 03/19/16 1126 03/19/16 1602 03/19/16 2106 03/20/16 0727 03/20/16 1115  GLUCAP 96 166* 76 97 125*       Signed:  HERNANDEZ ACOSTA,ESTELA  [facility] Pager: [phone] 03/20/2016, 4:05 PM

## 2016-03-20 NOTE — Progress Notes (Signed)
Johnathan KannerWilliam R Viereck discharged Home per MD order.  Discharge instructions reviewed and discussed with the patient, all questions and concerns answered. Copy of instructions and scripts given to patient.    Medication List    STOP taking these medications   GOODSENSE ASPIRIN 325 MG tablet Generic drug:  aspirin   warfarin 5 MG tablet Commonly known as:  COUMADIN     TAKE these medications   amLODipine 10 MG tablet Commonly known as:  NORVASC Take 10 mg by mouth daily.   ciprofloxacin 500 MG tablet Commonly known as:  CIPRO Take 1 tablet (500 mg total) by mouth 2 (two) times daily.   escitalopram 10 MG tablet Commonly known as:  LEXAPRO Take 10 mg by mouth daily.   ferrous sulfate 325 (65 FE) MG tablet Take 1 tablet (325 mg total) by mouth daily with breakfast.   Fish Oil 1200 MG Caps Take 1,200 mg by mouth daily.   FLUZONE HIGH-DOSE 0.5 ML Susy Generic drug:  Influenza Vac Split High-Dose TO BE ADMINISTERED BY PHARMACIST FOR IMMUNIZATION   gabapentin 300 MG capsule Commonly known as:  NEURONTIN Take 300 mg by mouth 2 (two) times daily.   Ginkgo Biloba 60 MG Tabs Take 60 mg by mouth daily.   glipiZIDE 10 MG tablet Commonly known as:  GLUCOTROL Take 10 mg by mouth 3 (three) times daily.   GLUCOSAMINE 1500 COMPLEX PO Take 1,500 mg by mouth daily.   LINZESS 72 MCG capsule Generic drug:  linaclotide Take 72 mcg by mouth daily as needed (hard stool).   lisinopril 20 MG tablet Commonly known as:  PRINIVIL,ZESTRIL Take 20 mg by mouth daily.   metFORMIN 1000 MG tablet Commonly known as:  GLUCOPHAGE Take 1,000 mg by mouth 2 (two) times daily.   metroNIDAZOLE 500 MG tablet Commonly known as:  FLAGYL Take 1 tablet (500 mg total) by mouth 3 (three) times daily.   multivitamin with minerals Tabs tablet Take 1 tablet by mouth daily.   omeprazole 20 MG capsule Commonly known as:  PRILOSEC Take 20 mg by mouth daily.   PREVNAR 13 Susp injection Generic drug:   pneumococcal 13-valent conjugate vaccine TO BE ADMINISTERED BY PHARMACIST FOR IMMUNIZATION   simvastatin 40 MG tablet Commonly known as:  ZOCOR Take 40 mg by mouth daily.   SM CALCIUM 500/VITAMIN D3 500-400 MG-UNIT tablet Generic drug:  calcium-vitamin D Take 1 tablet by mouth daily.   traMADol 50 MG tablet Commonly known as:  ULTRAM Take 50 mg by mouth 2 (two) times daily as needed for moderate pain.   ursodiol 250 MG tablet Commonly known as:  ACTIGALL Take 1 tablet (250 mg total) by mouth 2 (two) times daily.   VITAMIN B 12 PO Take 1,000 mg by mouth daily.   vitamin C 500 MG tablet Commonly known as:  ASCORBIC ACID Take 500 mg by mouth daily.       IV site discontinued and catheter remains intact. Site without signs and symptoms of complications. Dressing and pressure applied.  Patient escorted to car by NT in a wheelchair,  no distress noted upon discharge.  Rica KoyanagiBonnie M Boneta Standre 03/20/2016 3:05 PM

## 2016-03-20 NOTE — Discharge Instructions (Signed)
Stop taking coumadin. Follow up with cardiologist in 10 days to talk about other blood thinner options.

## 2016-03-21 ENCOUNTER — Telehealth: Payer: Self-pay | Admitting: Gastroenterology

## 2016-03-21 NOTE — Telephone Encounter (Signed)
Patient admitted with segmental acute enteritis over the weekend. Recent suspected biliary pancreatitis.   Needs hospital follow with RMR only in 2-3 weeks.

## 2016-03-22 ENCOUNTER — Encounter: Payer: Self-pay | Admitting: Internal Medicine

## 2016-03-22 NOTE — Telephone Encounter (Signed)
APPT MADE AND LETTER SENT  °

## 2016-03-23 ENCOUNTER — Ambulatory Visit: Payer: Medicare Other | Admitting: Gastroenterology

## 2016-03-24 LAB — PREPARE FRESH FROZEN PLASMA
BLOOD PRODUCT EXPIRATION DATE: 201711292359
ISSUE DATE / TIME: 201711242130
Unit Type and Rh: 600

## 2016-04-05 ENCOUNTER — Ambulatory Visit: Payer: Medicare Other | Admitting: Internal Medicine

## 2016-05-03 ENCOUNTER — Ambulatory Visit: Payer: Medicare Other | Admitting: Internal Medicine

## 2016-05-17 ENCOUNTER — Encounter: Payer: Self-pay | Admitting: Internal Medicine

## 2016-05-17 ENCOUNTER — Other Ambulatory Visit: Payer: Self-pay

## 2016-05-17 ENCOUNTER — Ambulatory Visit (INDEPENDENT_AMBULATORY_CARE_PROVIDER_SITE_OTHER): Payer: Medicare Other | Admitting: Internal Medicine

## 2016-05-17 VITALS — BP 113/63 | HR 60 | Temp 97.6°F | Ht 65.0 in | Wt 173.8 lb

## 2016-05-17 DIAGNOSIS — K219 Gastro-esophageal reflux disease without esophagitis: Secondary | ICD-10-CM | POA: Diagnosis not present

## 2016-05-17 DIAGNOSIS — K5909 Other constipation: Secondary | ICD-10-CM

## 2016-05-17 DIAGNOSIS — R945 Abnormal results of liver function studies: Principal | ICD-10-CM

## 2016-05-17 DIAGNOSIS — K8500 Idiopathic acute pancreatitis without necrosis or infection: Secondary | ICD-10-CM

## 2016-05-17 DIAGNOSIS — R7989 Other specified abnormal findings of blood chemistry: Secondary | ICD-10-CM | POA: Diagnosis not present

## 2016-05-17 NOTE — Progress Notes (Signed)
Primary Care Physician:  Isabella Bowens, MD Primary Gastroenterologist:  Dr. Jena Gauss  Pre-Procedure History & Physical: HPI:  Johnathan Chavez is a 76 y.o. 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 At least 2 significant episodes of pancreatitis temporally related to a bump in his transaminases. He was recently hospitalized with focal inflammatory changes of the mid small bowel consistent with a focal enteritis. Improved with antibiotics. He continues to have some breakthrough typical reflux symptoms on pantoprazole 20 mg daily. No dysphagia. Having no rectal bleeding or melena.  Colonoscopy by Dr. Allena Katz in [place] in 2016-internal hemorrhoids.  He continues to be anticoagulated now on Eliquis.  Started on Actigall recently empirically for possible microlithiasis. During one of his recent hospitalizations he saw Dr. Earlene Plater who did not feel that he needs gallbladder out acutely.  Linzess working well for constipation.  Prior gallbladder also demonstrated sludge versus tiny stones within the gallbladder.Marland Kitchen CT/MRCP negative for obvious common duct stone disease. No tumor seen in the pancreas.    Past Medical History:  Diagnosis Date  . A-fib (HCC)   . CAD (coronary artery disease)   . CKD (chronic kidney disease) stage 2, GFR 60-89 ml/min   . GERD (gastroesophageal reflux disease)   . HTN (hypertension)   . Hyperlipidemia   . 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 APPENDECTOMY    . BACK SURGERY     X2  . CERVICAL DISC SURGERY    . COLONOSCOPY  04/2014   [facility]: moderate sized internal hemorrhoids, 2 tubular adenomas removed from colon. diverticulosis  . CORONARY ARTERY BYPASS GRAFT  2001  . ESOPHAGOGASTRODUODENOSCOPY  01/2015   Dr. Allena Katz: gastritis  . INGUINAL HERNIA REPAIR Left   . SHOULDER ARTHROSCOPY Right   . SIGMOIDOSCOPY  01/2015   Dr.  Allena Katz: internal hemorrhoids  . TONSILLECTOMY      Prior to Admission medications   Medication Sig Start Date End Date Taking? Authorizing Provider  amLODipine (NORVASC) 10 MG tablet Take 10 mg by mouth daily. 12/08/15  Yes Historical Provider, MD  apixaban (ELIQUIS) 5 MG TABS tablet Take 5 mg by mouth 2 (two) times daily.   Yes Historical Provider, MD  calcium-vitamin D (SM CALCIUM 500/VITAMIN D3) 500-400 MG-UNIT tablet Take 1 tablet by mouth daily.    Yes Historical Provider, MD  Cyanocobalamin (VITAMIN B 12 PO) Take 1,000 mg by mouth daily.   Yes Historical Provider, MD  escitalopram (LEXAPRO) 10 MG tablet Take 10 mg by mouth daily.   Yes Historical Provider, MD  FLUZONE HIGH-DOSE 0.5 ML SUSY TO BE ADMINISTERED BY PHARMACIST FOR IMMUNIZATION 02/19/16  Yes Historical Provider, MD  gabapentin (NEURONTIN) 300 MG capsule Take 300 mg by mouth 2 (two) times daily.    Yes Historical Provider, MD  Ginkgo Biloba 60 MG TABS Take 60 mg by mouth daily.   Yes Historical Provider, MD  glipiZIDE (GLUCOTROL) 10 MG tablet Take 10 mg by mouth 3 (three) times daily.   Yes Historical Provider, MD  Glucosamine-Chondroit-Vit C-Mn (GLUCOSAMINE 1500 COMPLEX PO) Take 1,500 mg by mouth daily.   Yes Historical Provider, MD  linaclotide (LINZESS) 72 MCG capsule Take 72 mcg by mouth daily as needed (hard stool).    Yes Historical Provider, MD  lisinopril (PRINIVIL,ZESTRIL) 20 MG tablet Take 20 mg by mouth daily.    Yes Historical Provider, MD  metFORMIN (GLUCOPHAGE) 1000  MG tablet Take 1,000 mg by mouth 2 (two) times daily.   Yes Historical Provider, MD  Multiple Vitamin (MULTIVITAMIN WITH MINERALS) TABS tablet Take 1 tablet by mouth daily.   Yes Historical Provider, MD  Omega-3 Fatty Acids (FISH OIL) 1200 MG CAPS Take 1,200 mg by mouth daily.   Yes Historical Provider, MD  omeprazole (PRILOSEC) 20 MG capsule Take 20 mg by mouth daily.    Yes Historical Provider, MD  simvastatin (ZOCOR) 40 MG tablet Take 40 mg by mouth  daily.    Yes Historical Provider, MD  traMADol (ULTRAM) 50 MG tablet Take 50 mg by mouth 2 (two) times daily as needed for moderate pain.  10/13/15  Yes Historical Provider, MD  ursodiol (ACTIGALL) 250 MG tablet Take 1 tablet (250 mg total) by mouth 2 (two) times daily. 03/07/16  Yes [place] Cyndie MullL Johnson, MD  vitamin C (ASCORBIC ACID) 500 MG tablet Take 500 mg by mouth daily.    Yes Historical Provider, MD  ciprofloxacin (CIPRO) 500 MG tablet Take 1 tablet (500 mg total) by mouth 2 (two) times daily. Patient not taking: Reported on 05/17/2016 03/20/16   Henderson CloudEstela Y Hernandez Acosta, MD  ferrous sulfate 325 (65 FE) MG tablet Take 1 tablet (325 mg total) by mouth daily with breakfast. Patient not taking: Reported on 05/17/2016 03/07/16   [place] Cyndie MullL Johnson, MD  metroNIDAZOLE (FLAGYL) 500 MG tablet Take 1 tablet (500 mg total) by mouth 3 (three) times daily. Patient not taking: Reported on 05/17/2016 03/20/16   Henderson CloudEstela Y Hernandez Acosta, MD  PREVNAR 13 SUSP injection TO BE ADMINISTERED BY PHARMACIST FOR IMMUNIZATION 02/19/16   Historical Provider, MD    Allergies as of 05/17/2016 - Review Complete 05/17/2016  Allergen Reaction Noted  . Codeine Nausea Only 02/19/2015    Family History  Problem Relation Age of Onset  . Diabetes Mother   . Colon cancer Father     ?colon cancer, age early 2570    Social History   Social History  . Marital status: Married    Spouse name: N/A  . Number of children: N/A  . Years of education: N/A   Occupational History  . Not on file.   Social History Main Topics  . Smoking status: Never Smoker  . Smokeless tobacco: Never Used  . Alcohol use No  . Drug use: No  . Sexual activity: Not on file   Other Topics Concern  . Not on file   Social History Narrative  . No narrative on file    Review of Systems: See HPI, otherwise negative ROS  Physical Exam: BP 113/63   Pulse 60   Temp 97.6 F (36.4 C) (Oral)   Ht 5\' 5"  (1.651 m)   Wt 173 lb 12.8 oz (78.8  kg)   BMI 28.92 kg/m  General:   Alert,  Well-developed, well-nourished, pleasant and cooperative in NAD Lungs:  Clear throughout to auscultation.   No wheezes, crackles, or rhonchi. No acute distress. Heart:  Regular rate and rhythm; no murmurs, clicks, rubs,  or gallops. Abdomen: Non-distended, normal bowel sounds.  Soft and nontender without appreciable mass or hepatosplenomegaly.  Pulses:  Normal pulses noted. Extremities:  Without clubbing or edema.  Impression:  Pleasant 76 year old gentleman with reflux suboptimally controlled on pantoprazole 20 mg daily. Recent pancreatitis likely biliary in origin.  No tumor in pancreas on prior imaging. No other apparent reasons for pancreatitis. 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  Continue Actigall empirically.  Would not close the door on re-referral to a general surgery for cholecystectomy to help decrease the chances of future attacks of pancreatitis.  Further recommendations to follow    Notice: This dictation was prepared with Dragon dictation along with smaller phrase technology. Any transcriptional errors that result from this process are unintentional and may not be corrected upon review.

## 2016-05-17 NOTE — Patient Instructions (Signed)
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  It is possible you may still need your gallbladder out  Further recommendations to follow

## 2016-05-18 ENCOUNTER — Telehealth: Payer: Self-pay

## 2016-05-18 LAB — HEPATIC FUNCTION PANEL
ALBUMIN: 4 g/dL (ref 3.6–5.1)
ALT: 17 U/L (ref 9–46)
AST: 22 U/L (ref 10–35)
Alkaline Phosphatase: 67 U/L (ref 40–115)
Bilirubin, Direct: 0.1 mg/dL (ref <=0.2)
Indirect Bilirubin: 0.3 mg/dL (ref 0.2–1.2)
TOTAL PROTEIN: 6.7 g/dL (ref 6.1–8.1)
Total Bilirubin: 0.4 mg/dL (ref 0.2–1.2)

## 2016-05-18 LAB — LIPASE: LIPASE: 25 U/L (ref 7–60)

## 2016-05-18 NOTE — Telephone Encounter (Signed)
Thanks for clarification; he needs to increase it to 40 mg daily

## 2016-05-18 NOTE — Telephone Encounter (Signed)
Pt called- left a voicemail. He said he is taking omeprazole 20mg  a day.

## 2016-05-24 NOTE — Telephone Encounter (Signed)
Tried to call pt- NA- LMOM with recommendations.  

## 2016-05-26 ENCOUNTER — Ambulatory Visit (HOSPITAL_COMMUNITY)
Admission: RE | Admit: 2016-05-26 | Discharge: 2016-05-26 | Disposition: A | Discharge status: Home / Self Care | Payer: Medicare Other | Source: Ambulatory Visit | Attending: Internal Medicine | Admitting: Internal Medicine

## 2016-05-26 DIAGNOSIS — K8500 Idiopathic acute pancreatitis without necrosis or infection: Secondary | ICD-10-CM | POA: Diagnosis present

## 2016-05-26 DIAGNOSIS — I700 Atherosclerosis of aorta: Secondary | ICD-10-CM | POA: Insufficient documentation

## 2016-05-26 LAB — POCT I-STAT CREATININE: Creatinine, Ser: 1.6 mg/dL — ABNORMAL HIGH (ref 0.61–1.24)

## 2016-05-26 MED ORDER — IOPAMIDOL (ISOVUE-300) INJECTION 61%
80.0000 mL | Freq: Once | INTRAVENOUS | Status: AC | PRN
  Administered 2016-05-26: 80 mL via INTRAVENOUS

## 2016-06-09 ENCOUNTER — Other Ambulatory Visit: Payer: Self-pay

## 2016-06-09 DIAGNOSIS — K829 Disease of gallbladder, unspecified: Secondary | ICD-10-CM

## 2016-06-10 ENCOUNTER — Telehealth: Payer: Self-pay

## 2016-06-10 NOTE — Telephone Encounter (Signed)
Spoke with the pt, he said he has been having some problems with his phone. Informed him of his ct results and recommendations. He said it was ok to refer him to Dr.Jenkins.  Additionally he said he has been having some rectal bleeding, no pain. BRB in the water after a bm. He said he has external hemorrhoids that hang out and he has to push them back in. He wants to know if we can do anything about the bleeding?   Pt said if we cant reach him on his phone to call his wifeLegrand Rams- Chavez at [phone].

## 2016-06-10 NOTE — Telephone Encounter (Signed)
Please schedule ov and let pt know.

## 2016-06-10 NOTE — Telephone Encounter (Signed)
APPT MADE AND LEFT PATIENT VOICE MAIL WITH DATE AND TIME OF APPOINTMENT

## 2016-06-10 NOTE — Telephone Encounter (Signed)
He is reporting a new problem for which an office visit with extender is warranted and recommended next week

## 2016-06-15 ENCOUNTER — Ambulatory Visit: Payer: Medicare Other | Admitting: Nurse Practitioner

## 2016-07-12 ENCOUNTER — Ambulatory Visit (INDEPENDENT_AMBULATORY_CARE_PROVIDER_SITE_OTHER): Payer: Medicare Other | Admitting: General Surgery

## 2016-07-12 ENCOUNTER — Encounter: Payer: Self-pay | Admitting: General Surgery

## 2016-07-12 VITALS — BP 136/65 | HR 61 | Temp 98.4°F | Resp 18 | Ht 65.0 in | Wt 181.0 lb

## 2016-07-12 DIAGNOSIS — K8020 Calculus of gallbladder without cholecystitis without obstruction: Secondary | ICD-10-CM

## 2016-07-12 NOTE — H&P (Signed)
Johnathan Chavez; [id number]; 10/22/1940   HPI Patient is a 76-year-old white male who was referred to my care by Dr. Rourk for consideration of cholecystectomy. He had an episode of gallstone pancreatitis in January 2018. Workup revealed probable cholelithiasis. He denies any significant alcohol use. His pancreatitis resolved without problems. This was confirmed by a recent CT scan of the abdomen. Currently, he denies any nausea, vomiting, fatty food intolerance, fever, chills, jaundice. His pain is at 0. He does occasionally have an upset stomach. Past Medical History:  Diagnosis Date  . A-fib (HCC)   . CAD (coronary artery disease)   . CKD (chronic kidney disease) stage 2, GFR 60-89 ml/min   . GERD (gastroesophageal reflux disease)   . HTN (hypertension)   . Hyperlipidemia   . 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 APPENDECTOMY    . BACK SURGERY     X2  . CERVICAL DISC SURGERY    . COLONOSCOPY  04/2014   [facility]: moderate sized internal hemorrhoids, 2 tubular adenomas removed from colon. diverticulosis  . CORONARY ARTERY BYPASS GRAFT  2001  . ESOPHAGOGASTRODUODENOSCOPY  01/2015   Dr. Patel: gastritis  . INGUINAL HERNIA REPAIR Left   . SHOULDER ARTHROSCOPY Right   . SIGMOIDOSCOPY  01/2015   Dr. Patel: internal hemorrhoids  . TONSILLECTOMY      Family History  Problem Relation Age of Onset  . Diabetes Mother   . Colon cancer Father     ?colon cancer, age early 70    Current Outpatient Prescriptions on File Prior to Visit  Medication Sig Dispense Refill  . amLODipine (NORVASC) 10 MG tablet Take 10 mg by mouth daily.  0  . apixaban (ELIQUIS) 5 MG TABS tablet Take 5 mg by mouth 2 (two) times daily.    . calcium-vitamin D (SM CALCIUM 500/VITAMIN D3) 500-400 MG-UNIT tablet Take 1 tablet by mouth daily.     . ciprofloxacin (CIPRO) 500 MG tablet Take 1 tablet (500 mg total) by mouth 2 (two)  times daily. (Patient not taking: Reported on 05/17/2016) 14 tablet 0  . Cyanocobalamin (VITAMIN B 12 PO) Take 1,000 mg by mouth daily.    . escitalopram (LEXAPRO) 10 MG tablet Take 10 mg by mouth daily.    . ferrous sulfate 325 (65 FE) MG tablet Take 1 tablet (325 mg total) by mouth daily with breakfast. (Patient not taking: Reported on 05/17/2016) 30 tablet 0  . FLUZONE HIGH-DOSE 0.5 ML SUSY TO BE ADMINISTERED BY PHARMACIST FOR IMMUNIZATION  0  . gabapentin (NEURONTIN) 300 MG capsule Take 300 mg by mouth 2 (two) times daily.     . Ginkgo Biloba 60 MG TABS Take 60 mg by mouth daily.    . glipiZIDE (GLUCOTROL) 10 MG tablet Take 10 mg by mouth 3 (three) times daily.    . Glucosamine-Chondroit-Vit C-Mn (GLUCOSAMINE 1500 COMPLEX PO) Take 1,500 mg by mouth daily.    . linaclotide (LINZESS) 72 MCG capsule Take 72 mcg by mouth daily as needed (hard stool).     . lisinopril (PRINIVIL,ZESTRIL) 20 MG tablet Take 20 mg by mouth daily.     . metFORMIN (GLUCOPHAGE) 1000 MG tablet Take 1,000 mg by mouth 2 (two) times daily.    . metroNIDAZOLE (FLAGYL) 500 MG tablet Take 1 tablet (500 mg total) by mouth 3 (three) times daily. (Patient not taking: Reported on 05/17/2016) 21 tablet 0  .   Multiple Vitamin (MULTIVITAMIN WITH MINERALS) TABS tablet Take 1 tablet by mouth daily.    . Omega-3 Fatty Acids (FISH OIL) 1200 MG CAPS Take 1,200 mg by mouth daily.    . omeprazole (PRILOSEC) 20 MG capsule Take 20 mg by mouth daily.     . PREVNAR 13 SUSP injection TO BE ADMINISTERED BY PHARMACIST FOR IMMUNIZATION  0  . simvastatin (ZOCOR) 40 MG tablet Take 40 mg by mouth daily.     . traMADol (ULTRAM) 50 MG tablet Take 50 mg by mouth 2 (two) times daily as needed for moderate pain.   0  . ursodiol (ACTIGALL) 250 MG tablet Take 1 tablet (250 mg total) by mouth 2 (two) times daily. 60 tablet 0  . vitamin C (ASCORBIC ACID) 500 MG tablet Take 500 mg by mouth daily.      No current facility-administered medications on file prior to  visit.     Allergies  Allergen Reactions  . Codeine Nausea Only    History  Alcohol Use No    History  Smoking Status  . Never Smoker  Smokeless Tobacco  . Never Used    Review of Systems  Constitutional: Negative.   HENT: Negative.   Eyes: Negative.   Respiratory: Negative.   Cardiovascular: Negative.   Gastrointestinal: Positive for abdominal pain.  Genitourinary: Negative.   Musculoskeletal: Positive for back pain, joint pain and neck pain.  Skin: Negative.   Neurological: Negative.   Endo/Heme/Allergies: Bruises/bleeds easily.  Psychiatric/Behavioral: Negative.     Objective   Vitals:   07/12/16 0942  BP: 136/65  Pulse: 61  Resp: 18  Temp: 98.4 F (36.9 C)    Physical Exam  Constitutional: He is oriented to person, place, and time and well-developed, well-nourished, and in no distress.  HENT:  Head: Normocephalic and atraumatic.  Neck: Normal range of motion. Neck supple. No JVD present.  Cardiovascular: Normal rate and normal heart sounds.   No murmur heard. Irregular rhythm noted.  Pulmonary/Chest: Effort normal and breath sounds normal.  Abdominal: Soft. Bowel sounds are normal. He exhibits no distension. There is no tenderness. There is no rebound.  Neurological: He is alert and oriented to person, place, and time.  Skin: Skin is warm and dry.  Vitals reviewed.  Dr. Rourk's notes reviewed.  Assessment  Cholelithiasis, history of gallstone pancreatitis Plan   Patient is scheduled for laparoscopic cholecystectomy on 08/01/2016. The risks and benefits of the procedure including bleeding, infection, hepatobiliary injury, and the possibility of an open procedure were fully explained to the patient, who gave informed consent. Patient will stop his Eliquis 2 days before the surgery. 

## 2016-07-12 NOTE — Progress Notes (Signed)
Johnathan Chavez; [id number]; 12-07-1940   HPI Patient is a 76 year old white male who was referred to my care by Dr. Jena Gauss for consideration of cholecystectomy. He had an episode of gallstone pancreatitis in January 2018. Workup revealed probable cholelithiasis. He denies any significant alcohol use. His pancreatitis resolved without problems. This was confirmed by a recent CT scan of the abdomen. Currently, he denies any nausea, vomiting, fatty food intolerance, fever, chills, jaundice. His pain is at 0. He does occasionally have an upset stomach. Past Medical History:  Diagnosis Date  . A-fib (HCC)   . CAD (coronary artery disease)   . CKD (chronic kidney disease) stage 2, GFR 60-89 ml/min   . GERD (gastroesophageal reflux disease)   . HTN (hypertension)   . Hyperlipidemia   . 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 APPENDECTOMY    . BACK SURGERY     X2  . CERVICAL DISC SURGERY    . COLONOSCOPY  04/2014   [facility]: moderate sized internal hemorrhoids, 2 tubular adenomas removed from colon. diverticulosis  . CORONARY ARTERY BYPASS GRAFT  2001  . ESOPHAGOGASTRODUODENOSCOPY  01/2015   Dr. Allena Katz: gastritis  . INGUINAL HERNIA REPAIR Left   . SHOULDER ARTHROSCOPY Right   . SIGMOIDOSCOPY  01/2015   Dr. Allena Katz: internal hemorrhoids  . TONSILLECTOMY      Family History  Problem Relation Age of Onset  . Diabetes Mother   . Colon cancer Father     ?colon cancer, age early 33    Current Outpatient Prescriptions on File Prior to Visit  Medication Sig Dispense Refill  . amLODipine (NORVASC) 10 MG tablet Take 10 mg by mouth daily.  0  . apixaban (ELIQUIS) 5 MG TABS tablet Take 5 mg by mouth 2 (two) times daily.    . calcium-vitamin D (SM CALCIUM 500/VITAMIN D3) 500-400 MG-UNIT tablet Take 1 tablet by mouth daily.     . ciprofloxacin (CIPRO) 500 MG tablet Take 1 tablet (500 mg total) by mouth 2 (two)  times daily. (Patient not taking: Reported on 05/17/2016) 14 tablet 0  . Cyanocobalamin (VITAMIN B 12 PO) Take 1,000 mg by mouth daily.    Marland Kitchen escitalopram (LEXAPRO) 10 MG tablet Take 10 mg by mouth daily.    . ferrous sulfate 325 (65 FE) MG tablet Take 1 tablet (325 mg total) by mouth daily with breakfast. (Patient not taking: Reported on 05/17/2016) 30 tablet 0  . FLUZONE HIGH-DOSE 0.5 ML SUSY TO BE ADMINISTERED BY PHARMACIST FOR IMMUNIZATION  0  . gabapentin (NEURONTIN) 300 MG capsule Take 300 mg by mouth 2 (two) times daily.     . Ginkgo Biloba 60 MG TABS Take 60 mg by mouth daily.    Marland Kitchen glipiZIDE (GLUCOTROL) 10 MG tablet Take 10 mg by mouth 3 (three) times daily.    . Glucosamine-Chondroit-Vit C-Mn (GLUCOSAMINE 1500 COMPLEX PO) Take 1,500 mg by mouth daily.    Marland Kitchen linaclotide (LINZESS) 72 MCG capsule Take 72 mcg by mouth daily as needed (hard stool).     Marland Kitchen lisinopril (PRINIVIL,ZESTRIL) 20 MG tablet Take 20 mg by mouth daily.     . metFORMIN (GLUCOPHAGE) 1000 MG tablet Take 1,000 mg by mouth 2 (two) times daily.    . metroNIDAZOLE (FLAGYL) 500 MG tablet Take 1 tablet (500 mg total) by mouth 3 (three) times daily. (Patient not taking: Reported on 05/17/2016) 21 tablet 0  .  Multiple Vitamin (MULTIVITAMIN WITH MINERALS) TABS tablet Take 1 tablet by mouth daily.    . Omega-3 Fatty Acids (FISH OIL) 1200 MG CAPS Take 1,200 mg by mouth daily.    Marland Kitchen omeprazole (PRILOSEC) 20 MG capsule Take 20 mg by mouth daily.     Marland Kitchen PREVNAR 13 SUSP injection TO BE ADMINISTERED BY PHARMACIST FOR IMMUNIZATION  0  . simvastatin (ZOCOR) 40 MG tablet Take 40 mg by mouth daily.     . traMADol (ULTRAM) 50 MG tablet Take 50 mg by mouth 2 (two) times daily as needed for moderate pain.   0  . ursodiol (ACTIGALL) 250 MG tablet Take 1 tablet (250 mg total) by mouth 2 (two) times daily. 60 tablet 0  . vitamin C (ASCORBIC ACID) 500 MG tablet Take 500 mg by mouth daily.      No current facility-administered medications on file prior to  visit.     Allergies  Allergen Reactions  . Codeine Nausea Only    History  Alcohol Use No    History  Smoking Status  . Never Smoker  Smokeless Tobacco  . Never Used    Review of Systems  Constitutional: Negative.   HENT: Negative.   Eyes: Negative.   Respiratory: Negative.   Cardiovascular: Negative.   Gastrointestinal: Positive for abdominal pain.  Genitourinary: Negative.   Musculoskeletal: Positive for back pain, joint pain and neck pain.  Skin: Negative.   Neurological: Negative.   Endo/Heme/Allergies: Bruises/bleeds easily.  Psychiatric/Behavioral: Negative.     Objective   Vitals:   07/12/16 0942  BP: 136/65  Pulse: 61  Resp: 18  Temp: 98.4 F (36.9 C)    Physical Exam  Constitutional: He is oriented to person, place, and time and well-developed, well-nourished, and in no distress.  HENT:  Head: Normocephalic and atraumatic.  Neck: Normal range of motion. Neck supple. No JVD present.  Cardiovascular: Normal rate and normal heart sounds.   No murmur heard. Irregular rhythm noted.  Pulmonary/Chest: Effort normal and breath sounds normal.  Abdominal: Soft. Bowel sounds are normal. He exhibits no distension. There is no tenderness. There is no rebound.  Neurological: He is alert and oriented to person, place, and time.  Skin: Skin is warm and dry.  Vitals reviewed.  Dr. Luvenia Starch notes reviewed.  Assessment  Cholelithiasis, history of gallstone pancreatitis Plan   Patient is scheduled for laparoscopic cholecystectomy on 08/01/2016. The risks and benefits of the procedure including bleeding, infection, hepatobiliary injury, and the possibility of an open procedure were fully explained to the patient, who gave informed consent. Patient will stop his Eliquis 2 days before the surgery.

## 2016-07-12 NOTE — Patient Instructions (Addendum)
Stop Eliquis on 07/30/16.  Laparoscopic Cholecystectomy Laparoscopic cholecystectomy is surgery to remove the gallbladder. The gallbladder is a pear-shaped organ that lies beneath the liver on the right side of the body. The gallbladder stores bile, which is a fluid that helps the body to digest fats. Cholecystectomy is often done for inflammation of the gallbladder (cholecystitis). This condition is usually caused by a buildup of gallstones (cholelithiasis) in the gallbladder. Gallstones can block the flow of bile, which can result in inflammation and pain. In severe cases, emergency surgery may be required. This procedure is done though small incisions in your abdomen (laparoscopic surgery). A thin scope with a camera (laparoscope) is inserted through one incision. Thin surgical instruments are inserted through the other incisions. In some cases, a laparoscopic procedure may be turned into a type of surgery that is done through a larger incision (open surgery). Tell a health care provider about:  Any allergies you have.  All medicines you are taking, including vitamins, herbs, eye drops, creams, and over-the-counter medicines.  Any problems you or family members have had with anesthetic medicines.  Any blood disorders you have.  Any surgeries you have had.  Any medical conditions you have.  Whether you are pregnant or may be pregnant. What are the risks? Generally, this is a safe procedure. However, problems may occur, including:  Infection.  Bleeding.  Allergic reactions to medicines.  Damage to other structures or organs.  A stone remaining in the common bile duct. The common bile duct carries bile from the gallbladder into the small intestine.  A bile leak from the cyst duct that is clipped when your gallbladder is removed. What happens before the procedure? Staying hydrated  Follow instructions from your health care provider about hydration, which may include:  Up to 2 hours  before the procedure - you may continue to drink clear liquids, such as water, clear fruit juice, black coffee, and plain tea. Eating and drinking restrictions  Follow instructions from your health care provider about eating and drinking, which may include:  8 hours before the procedure - stop eating heavy meals or foods such as meat, fried foods, or fatty foods.  6 hours before the procedure - stop eating light meals or foods, such as toast or cereal.  6 hours before the procedure - stop drinking milk or drinks that contain milk.  2 hours before the procedure - stop drinking clear liquids. Medicines   Ask your health care provider about:  Changing or stopping your regular medicines. This is especially important if you are taking diabetes medicines or blood thinners.  Taking medicines such as aspirin and ibuprofen. These medicines can thin your blood. Do not take these medicines before your procedure if your health care provider instructs you not to.  You may be given antibiotic medicine to help prevent infection. General instructions   Let your health care provider know if you develop a cold or an infection before surgery.  Plan to have someone take you home from the hospital or clinic.  Ask your health care provider how your surgical site will be marked or identified. What happens during the procedure?  To reduce your risk of infection:  Your health care team will wash or sanitize their hands.  Your skin will be washed with soap.  Hair may be removed from the surgical area.  An IV tube may be inserted into one of your veins.  You will be given one or more of the following:  A  medicine to help you relax (sedative).  A medicine to make you fall asleep (general anesthetic).  A breathing tube will be placed in your mouth.  Your surgeon will make several small cuts (incisions) in your abdomen.  The laparoscope will be inserted through one of the small incisions. The  camera on the laparoscope will send images to a TV screen (monitor) in the operating room. This lets your surgeon see inside your abdomen.  Air-like gas will be pumped into your abdomen. This will expand your abdomen to give the surgeon more room to perform the surgery.  Other tools that are needed for the procedure will be inserted through the other incisions. The gallbladder will be removed through one of the incisions.  Your common bile duct may be examined. If stones are found in the common bile duct, they may be removed.  After your gallbladder has been removed, the incisions will be closed with stitches (sutures), staples, or skin glue.  Your incisions may be covered with a bandage (dressing). The procedure may vary among health care providers and hospitals. What happens after the procedure?  Your blood pressure, heart rate, breathing rate, and blood oxygen level will be monitored until the medicines you were given have worn off.  You will be given medicines as needed to control your pain.  Do not drive for 24 hours if you were given a sedative. This information is not intended to replace advice given to you by your health care provider. Make sure you discuss any questions you have with your health care provider. Document Released: 04/11/2005 Document Revised: 11/01/2015 Document Reviewed: 09/28/2015 Elsevier Interactive Patient Education  2017 [employer] Cholelithiasis Cholelithiasis is a form of gallbladder disease in which gallstones form in the gallbladder. The gallbladder is an organ that stores bile. Bile is made in the liver, and it helps to digest fats. Gallstones begin as small crystals and slowly grow into stones. They may cause no symptoms until the gallbladder tightens (contracts) and a gallstone is blocking the duct (gallbladder attack), which can cause pain. Cholelithiasis is also referred to as gallstones. There are two main types of gallstones:  Cholesterol stones.  These are made of hardened cholesterol and are usually yellow-green in color. They are the most common type of gallstone. Cholesterol is a white, waxy, fat-like substance that is made in the liver.  Pigment stones. These are dark in color and are made of a red-yellow substance that forms when hemoglobin from red blood cells breaks down (bilirubin). What are the causes? This condition may be caused by an imbalance in the substances that bile is made of. This can happen if the bile:  Has too much bilirubin.  Has too much cholesterol.  Does not have enough bile salts. These salts help the body absorb and digest fats. In some cases, this condition can also be caused by the gallbladder not emptying completely or often enough. What increases the risk? The following factors may make you more likely to develop this condition:  Being male.  Having multiple pregnancies. Health care providers sometimes advise removing diseased gallbladders before future pregnancies.  Eating a diet that is heavy in fried foods, fat, and refined carbohydrates, like white bread and white rice.  Being obese.  Being older than age 74.  Prolonged use of medicines that contain male hormones (estrogen).  Having diabetes mellitus.  Rapidly losing weight.  Having a family history of gallstones.  Being of [place] or [place] descent.  Having an  intestinal disease such as Crohn disease.  Having metabolic syndrome.  Having cirrhosis.  Having severe types of anemia such as sickle cell anemia. What are the signs or symptoms? In most cases, there are no symptoms. These are known as silent gallstones. If a gallstone blocks the bile ducts, it can cause a gallbladder attack. The main symptom of a gallbladder attack is sudden pain in the upper right abdomen. The pain usually comes at night or after eating a large meal. The pain can last for one or several hours and can spread to the right shoulder or  chest. If the bile duct is blocked for more than a few hours, it can cause infection or inflammation of the gallbladder, liver, or pancreas, which may cause:  Nausea.  Vomiting.  Abdominal pain that lasts for 5 hours or more.  Fever or chills.  Yellowing of the skin or the whites of the eyes (jaundice).  Dark urine.  Light-colored stools. How is this diagnosed? This condition may be diagnosed based on:  A physical exam.  Your medical history.  An ultrasound of your gallbladder.  CT scan.  MRI.  Blood tests to check for signs of infection or inflammation.  A scan of your gallbladder and bile ducts (biliary system) using nonharmful radioactive material and special cameras that can see the radioactive material (cholescintigram). This test checks to see how your gallbladder contracts and whether bile ducts are blocked.  Inserting a small tube with a camera on the end (endoscope) through your mouth to inspect bile ducts and check for blockages (endoscopic retrograde cholangiopancreatogram). How is this treated? Treatment for gallstones depends on the severity of the condition. Silent gallstones do not need treatment. If the gallstones cause a gallbladder attack or other symptoms, treatment may be required. Options for treatment include:  Surgery to remove the gallbladder (cholecystectomy). This is the most common treatment.  Medicines to dissolve gallstones. These are most effective at treating small gallstones. You may need to take medicines for up to 6-12 months.  Shock wave treatment (extracorporeal biliary lithotripsy). In this treatment, an ultrasound machine sends shock waves to the gallbladder to break gallstones into smaller pieces. These pieces can then be passed into the intestines or be dissolved by medicine. This is rarely used.  Removing gallstones through endoscopic retrograde cholangiopancreatogram. A small basket can be attached to the endoscope and used to  capture and remove gallstones. Follow these instructions at home:  Take over-the-counter and prescription medicines only as told by your health care provider.  Maintain a healthy weight and follow a healthy diet. This includes:  Reducing fatty foods, such as fried food.  Reducing refined carbohydrates, like white bread and white rice.  Increasing fiber. Aim for foods like almonds, fruit, and beans.  Keep all follow-up visits as told by your health care provider. This is important. Contact a health care provider if:  You think you have had a gallbladder attack.  You have been diagnosed with silent gallstones and you develop abdominal pain or indigestion. Get help right away if:  You have pain from a gallbladder attack that lasts for more than 2 hours.  You have abdominal pain that lasts for more than 5 hours.  You have a fever or chills.  You have persistent nausea and vomiting.  You develop jaundice.  You have dark urine or light-colored stools. Summary  Cholelithiasis (also called gallstones) is a form of gallbladder disease in which gallstones form in the gallbladder.  This condition is  caused by an imbalance in the substances that make up bile. This can happen if the bile has too much cholesterol, too much bilirubin, or not enough bile salts.  You are more likely to develop this condition if you are male, pregnant, using medicines with estrogen, obese, older than age 2, or have a family history of gallstones. You may also develop gallstones if you have diabetes, an intestinal disease, cirrhosis, or metabolic syndrome.  Treatment for gallstones depends on the severity of the condition. Silent gallstones do not need treatment.  If gallstones cause a gallbladder attack or other symptoms, treatment may be needed. The most common treatment is surgery to remove the gallbladder. This information is not intended to replace advice given to you by your health care provider.  Make sure you discuss any questions you have with your health care provider. Document Released: 04/07/2005 Document Revised: 12/27/2015 Document Reviewed: 12/27/2015 Elsevier Interactive Patient Education  2017 [employer].

## 2016-07-21 ENCOUNTER — Ambulatory Visit (INDEPENDENT_AMBULATORY_CARE_PROVIDER_SITE_OTHER): Payer: Medicare Other | Admitting: Nurse Practitioner

## 2016-07-21 ENCOUNTER — Encounter: Payer: Self-pay | Admitting: Nurse Practitioner

## 2016-07-21 VITALS — BP 117/64 | HR 67 | Temp 97.9°F | Ht 65.0 in | Wt 178.4 lb

## 2016-07-21 DIAGNOSIS — K851 Biliary acute pancreatitis without necrosis or infection: Secondary | ICD-10-CM | POA: Diagnosis not present

## 2016-07-21 DIAGNOSIS — K649 Unspecified hemorrhoids: Secondary | ICD-10-CM

## 2016-07-21 DIAGNOSIS — K5909 Other constipation: Secondary | ICD-10-CM

## 2016-07-21 DIAGNOSIS — K625 Hemorrhage of anus and rectum: Secondary | ICD-10-CM | POA: Insufficient documentation

## 2016-07-21 MED ORDER — LINACLOTIDE 72 MCG PO CAPS: 72.0000 ug | ORAL_CAPSULE | Freq: Every day | ORAL | 5 refills | Status: AC

## 2016-07-21 NOTE — Assessment & Plan Note (Signed)
Negative history of hemorrhoids. Topical therapy not effective. Not a candidate for banding due to chronic Eliquis for atrial fibrillation. He is open and amenable to discussing with surgery about surgical options. I have recommended that he call Dr. Lovell SheehanJenkins to discuss his situation given that he has cholecystectomy scheduled for a couple weeks from now. Return for follow-up as needed.

## 2016-07-21 NOTE — Patient Instructions (Signed)
1. I have refilled Linzess 72 mcg to your pharmacy. Takes this once a day (on an EMPTY STOMACH). If you have diarrhea that lasts longer than 5-7 days or is intolerable, you can cut back to every other day. 2. Talk with Dr. Lovell SheehanJenkins about hemorrhoid surgery. We can give you another separate referral if needed 3. Good luck with your gallbladder surgery 4. Call us if you have any other stomach/colon problems.

## 2016-07-21 NOTE — Assessment & Plan Note (Signed)
History of biliary pancreatitis. He is now scheduled for cholecystectomy in about 2 weeks. Return for follow-up as needed.

## 2016-07-21 NOTE — Assessment & Plan Note (Signed)
Noted occasional constipation with straining. He was previously on Linzess but does not think he is on a right now. I will refill his Linzess. I talked to him about taking it once a day or backing off to once every other day dependent on his reaction to the medication. Previously he was taking it every other day. He verbalized understanding. Return for follow-up as needed.

## 2016-07-21 NOTE — Progress Notes (Signed)
Referring Provider: Eusebio Me* Primary Care Physician:  Isabella Bowens, MD Primary GI:  Dr. Jena Gauss  Chief Complaint  Patient presents with  . Rectal Bleeding  . Hemorrhoids    HPI:   Johnathan Chavez is a 76 y.o. male who presents For follow-up on rectal bleeding and hemorrhoids. The patient was last seen in our office 05/17/2016 for elevated LFTs, GERD, constipation, idiopathic acute pancreatitis. 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 Recent hospitalization with small bowel changes consistent with focal enteritis that improves on antibiotics. Some breakthrough reflux on Protonix 20 mg daily. No rectal bleeding noted at that time. Colonoscopy recently completed in 2016 which found internal hemorrhoids. He is on anticoagulation. MRCP found no acute stone in the common bile duct, no pancreatic cancer. His for tonics was increased to 40 mg daily, recommended pancreatic protocol CT, HSP and lipase, continue Actigall empirically and follow up with surgery for possible cholecystectomy.  Lipase and LFTs were normal. CT completed to 05/14/2016 which found no radiographic evidence of pancreatitis, pancreatic mass, or other acute findings. He has an appointment scheduled for cholecystectomy in April of this year.  Last visit for constipation and hemorrhoids on 12/16/2015 for which she was started on Amitiza 24 g twice a day and hydrocortisone rectal cream twice a day for 2 weeks. Deemed likely good candidate for hemorrhoid banding in office. He followed up for hemorrhoid banding on 01/19/2016 at which point he noted Linzess 72 g every other day was more cost effective than Amitiza and he is pleased with his constipation management. Rectal bleeding ceased. No hemorrhoid banding completed because the patient is anticoagulated related to atrial  fibrillation.  Today he states he's doing well overall. Has significant hemorrhoids after bowel movements and subsequent anal leakage. Is having to use pads. Rectal cream has not improved his hemorrhoids. He was not a candidate due to chronic anticoagulation. He has occasional rectal bleeding. Eliquis was reduced. Hemorrhoids protrude and cause leakage, etc. No other overt hemorrhoid symptoms. Always has "stomach ache." Has a bowel movement at least every other day. Doesn't think he's currently on Linzess. Denies chest pain, dyspnea, dizziness, lightheadedness, syncope, near syncope. Denies any other upper or lower GI symptoms.   Past Medical History:  Diagnosis Date  . A-fib (HCC)   . CAD (coronary artery disease)   . CKD (chronic kidney disease) stage 2, GFR 60-89 ml/min   . GERD (gastroesophageal reflux disease)   . HTN (hypertension)   . Hyperlipidemia   . 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 APPENDECTOMY    . BACK SURGERY     X2  . CERVICAL DISC SURGERY    . COLONOSCOPY  04/2014   [facility]: moderate sized internal hemorrhoids, 2 tubular adenomas removed from colon. diverticulosis  . CORONARY ARTERY BYPASS GRAFT  2001  . ESOPHAGOGASTRODUODENOSCOPY  01/2015   Dr. Allena Katz: gastritis  . INGUINAL HERNIA REPAIR Left   . SHOULDER ARTHROSCOPY Right   . SIGMOIDOSCOPY  01/2015   Dr. Allena Katz: internal hemorrhoids  . TONSILLECTOMY      Current Outpatient Prescriptions  Medication Sig Dispense Refill  . amLODipine (NORVASC) 10 MG tablet Take 10 mg by mouth daily.  0  . apixaban (ELIQUIS) 5 MG TABS tablet Take 5 mg by mouth 2 (two)  times daily.    . calcium-vitamin D (SM CALCIUM 500/VITAMIN D3) 500-400 MG-UNIT tablet Take 1 tablet by mouth daily.     . ciprofloxacin (CIPRO) 500 MG tablet Take 1 tablet (500 mg total) by mouth 2 (two) times daily. 14 tablet 0  . Cyanocobalamin (VITAMIN B 12 PO) Take 1,000  mg by mouth daily.    Marland Kitchen escitalopram (LEXAPRO) 10 MG tablet Take 10 mg by mouth daily.    . ferrous sulfate 325 (65 FE) MG tablet Take 1 tablet (325 mg total) by mouth daily with breakfast. 30 tablet 0  . FLUZONE HIGH-DOSE 0.5 ML SUSY TO BE ADMINISTERED BY PHARMACIST FOR IMMUNIZATION  0  . gabapentin (NEURONTIN) 300 MG capsule Take 300 mg by mouth 2 (two) times daily.     . Ginkgo Biloba 60 MG TABS Take 60 mg by mouth daily.    Marland Kitchen glipiZIDE (GLUCOTROL) 10 MG tablet Take 10 mg by mouth 3 (three) times daily.    . Glucosamine-Chondroit-Vit C-Mn (GLUCOSAMINE 1500 COMPLEX PO) Take 1,500 mg by mouth daily.    Marland Kitchen linaclotide (LINZESS) 72 MCG capsule Take 72 mcg by mouth daily as needed (hard stool).     Marland Kitchen lisinopril (PRINIVIL,ZESTRIL) 20 MG tablet Take 20 mg by mouth daily.     . metroNIDAZOLE (FLAGYL) 500 MG tablet Take 1 tablet (500 mg total) by mouth 3 (three) times daily. 21 tablet 0  . Multiple Vitamin (MULTIVITAMIN WITH MINERALS) TABS tablet Take 1 tablet by mouth daily.    . Omega-3 Fatty Acids (FISH OIL) 1200 MG CAPS Take 1,200 mg by mouth daily.    Marland Kitchen omeprazole (PRILOSEC) 20 MG capsule Take 20 mg by mouth daily.     Marland Kitchen PREVNAR 13 SUSP injection TO BE ADMINISTERED BY PHARMACIST FOR IMMUNIZATION  0  . simvastatin (ZOCOR) 40 MG tablet Take 40 mg by mouth daily.     . traMADol (ULTRAM) 50 MG tablet Take 50 mg by mouth 2 (two) times daily as needed for moderate pain.   0  . ursodiol (ACTIGALL) 250 MG tablet Take 1 tablet (250 mg total) by mouth 2 (two) times daily. 60 tablet 0  . vitamin C (ASCORBIC ACID) 500 MG tablet Take 500 mg by mouth daily.      No current facility-administered medications for this visit.     Allergies as of 07/21/2016 - Review Complete 07/21/2016  Allergen Reaction Noted  . Codeine Nausea Only 02/19/2015    Family History  Problem Relation Age of Onset  . Diabetes Mother   . Colon cancer Father     ?colon cancer, age early 38    Social History   Social History   . Marital status: Married    Spouse name: N/A  . Number of children: N/A  . Years of education: N/A   Social History Main Topics  . Smoking status: Never Smoker  . Smokeless tobacco: Never Used  . Alcohol use No  . Drug use: No  . Sexual activity: Not Asked   Other Topics Concern  . None   Social History Narrative  . None    Review of Systems: General: Negative for anorexia, weight loss, fever, chills, fatigue, weakness. ENT: Negative for hoarseness, difficulty swallowing , nasal congestion. CV: Negative for chest pain, angina, palpitations, dyspnea on exertion, peripheral edema.  Respiratory: Negative for dyspnea at rest, dyspnea on exertion, cough, sputum, wheezing.  GI: See history of present illness. Endo: Negative for unusual weight change.  Heme: Negative for bruising  or bleeding. Allergy: Negative for rash or hives.   Physical Exam: BP 117/64   Pulse 67   Temp 97.9 F (36.6 C) (Oral)   Ht 5\' 5"  (1.651 m)   Wt 178 lb 6.4 oz (80.9 kg)   BMI 29.69 kg/m  General:   Alert and oriented. Pleasant and cooperative. Well-nourished and well-developed.  Eyes:  Without icterus, sclera clear and conjunctiva pink.  Ears:  Normal auditory acuity. Cardiovascular:  S1, S2 present without murmurs appreciated. Extremities without clubbing or edema. Respiratory:  Clear to auscultation bilaterally. No wheezes, rales, or rhonchi. No distress.  Gastrointestinal:  +BS, soft, non-tender and non-distended. No HSM noted. No guarding or rebound. No masses appreciated.  Rectal:  Deferred  Musculoskalatal:  Symmetrical without gross deformities. Neurologic:  Alert and oriented x4;  grossly normal neurologically. Psych:  Alert and cooperative. Normal mood and affect. Heme/Lymph/Immune: No excessive bruising noted.    07/21/2016 12:14 PM   Disclaimer: This note was dictated with voice recognition software. Similar sounding words can inadvertently be transcribed and may not be corrected  upon review.

## 2016-07-25 NOTE — Progress Notes (Signed)
CC'D TO PCP °

## 2016-07-26 NOTE — Patient Instructions (Signed)
Johnathan Chavez  07/26/2016     @PREFPERIOPPHARMACY @   Your procedure is scheduled on  08/05/2016   Report to [facility] at  615   A.M.  Call this number if you have problems the morning of surgery:  [phone]   Remember:  Do not eat food or drink liquids after midnight.  Take these medicines the morning of surgery with A SIP OF WATER  norvasc, lexapro, neirontin, lisinopril, prilosec, ultram, ursodilol.   Do not wear jewelry, make-up or nail polish.  Do not wear lotions, powders, or perfumes, or deoderant.  Do not shave 48 hours prior to surgery.  Men may shave face and neck.  Do not bring valuables to the hospital.  [facility] is not responsible for any belongings or valuables.  Contacts, dentures or bridgework may not be worn into surgery.  Leave your suitcase in the car.  After surgery it may be brought to your room.  For patients admitted to the hospital, discharge time will be determined by your treatment team.  Patients discharged the day of surgery will not be allowed to drive home.   Name and phone number of your driver:   family Special instructions:  None  Please read over the following fact sheets that you were given. Anesthesia Post-op Instructions and Care and Recovery After Surgery       Laparoscopic Cholecystectomy Laparoscopic cholecystectomy is surgery to remove the gallbladder. The gallbladder is a pear-shaped organ that lies beneath the liver on the right side of the body. The gallbladder stores bile, which is a fluid that helps the body to digest fats. Cholecystectomy is often done for inflammation of the gallbladder (cholecystitis). This condition is usually caused by a buildup of gallstones (cholelithiasis) in the gallbladder. Gallstones can block the flow of bile, which can result in inflammation and pain. In severe cases, emergency surgery may be required. This procedure is done though small incisions in your abdomen (laparoscopic  surgery). A thin scope with a camera (laparoscope) is inserted through one incision. Thin surgical instruments are inserted through the other incisions. In some cases, a laparoscopic procedure may be turned into a type of surgery that is done through a larger incision (open surgery). Tell a health care provider about:  Any allergies you have.  All medicines you are taking, including vitamins, herbs, eye drops, creams, and over-the-counter medicines.  Any problems you or family members have had with anesthetic medicines.  Any blood disorders you have.  Any surgeries you have had.  Any medical conditions you have.  Whether you are pregnant or may be pregnant. What are the risks? Generally, this is a safe procedure. However, problems may occur, including:  Infection.  Bleeding.  Allergic reactions to medicines.  Damage to other structures or organs.  A stone remaining in the common bile duct. The common bile duct carries bile from the gallbladder into the small intestine.  A bile leak from the cyst duct that is clipped when your gallbladder is removed. What happens before the procedure? Staying hydrated  Follow instructions from your health care provider about hydration, which may include:  Up to 2 hours before the procedure - you may continue to drink clear liquids, such as water, clear fruit juice, black coffee, and plain tea. Eating and drinking restrictions  Follow instructions from your health care provider about eating and drinking, which may include:  8 hours before the procedure - stop eating heavy  meals or foods such as meat, fried foods, or fatty foods.  6 hours before the procedure - stop eating light meals or foods, such as toast or cereal.  6 hours before the procedure - stop drinking milk or drinks that contain milk.  2 hours before the procedure - stop drinking clear liquids. Medicines   Ask your health care provider about:  Changing or stopping your  regular medicines. This is especially important if you are taking diabetes medicines or blood thinners.  Taking medicines such as aspirin and ibuprofen. These medicines can thin your blood. Do not take these medicines before your procedure if your health care provider instructs you not to.  You may be given antibiotic medicine to help prevent infection. General instructions   Let your health care provider know if you develop a cold or an infection before surgery.  Plan to have someone take you home from the hospital or clinic.  Ask your health care provider how your surgical site will be marked or identified. What happens during the procedure?  To reduce your risk of infection:  Your health care team will wash or sanitize their hands.  Your skin will be washed with soap.  Hair may be removed from the surgical area.  An IV tube may be inserted into one of your veins.  You will be given one or more of the following:  A medicine to help you relax (sedative).  A medicine to make you fall asleep (general anesthetic).  A breathing tube will be placed in your mouth.  Your surgeon will make several small cuts (incisions) in your abdomen.  The laparoscope will be inserted through one of the small incisions. The camera on the laparoscope will send images to a TV screen (monitor) in the operating room. This lets your surgeon see inside your abdomen.  Air-like gas will be pumped into your abdomen. This will expand your abdomen to give the surgeon more room to perform the surgery.  Other tools that are needed for the procedure will be inserted through the other incisions. The gallbladder will be removed through one of the incisions.  Your common bile duct may be examined. If stones are found in the common bile duct, they may be removed.  After your gallbladder has been removed, the incisions will be closed with stitches (sutures), staples, or skin glue.  Your incisions may be covered  with a bandage (dressing). The procedure may vary among health care providers and hospitals. What happens after the procedure?  Your blood pressure, heart rate, breathing rate, and blood oxygen level will be monitored until the medicines you were given have worn off.  You will be given medicines as needed to control your pain.  Do not drive for 24 hours if you were given a sedative. This information is not intended to replace advice given to you by your health care provider. Make sure you discuss any questions you have with your health care provider. Document Released: 04/11/2005 Document Revised: 11/01/2015 Document Reviewed: 09/28/2015 Elsevier Interactive Patient Education  2017 [employer]  Laparoscopic Cholecystectomy, Care After This sheet gives you information about how to care for yourself after your procedure. Your health care provider may also give you more specific instructions. If you have problems or questions, contact your health care provider. What can I expect after the procedure? After the procedure, it is common to have:  Pain at your incision sites. You will be given medicines to control this pain.  Mild nausea  or vomiting.  Bloating and possible shoulder pain from the air-like gas that was used during the procedure. Follow these instructions at home: Incision care    Follow instructions from your health care provider about how to take care of your incisions. Make sure you:  Wash your hands with soap and water before you change your bandage (dressing). If soap and water are not available, use hand sanitizer.  Change your dressing as told by your health care provider.  Leave stitches (sutures), skin glue, or adhesive strips in place. These skin closures may need to be in place for 2 weeks or longer. If adhesive strip edges start to loosen and curl up, you may trim the loose edges. Do not remove adhesive strips completely unless your health care provider tells you  to do that.  Do not take baths, swim, or use a hot tub until your health care provider approves. Ask your health care provider if you can take showers. You may only be allowed to take sponge baths for bathing.  Check your incision area every day for signs of infection. Check for:  More redness, swelling, or pain.  More fluid or blood.  Warmth.  Pus or a bad smell. Activity   Do not drive or use heavy machinery while taking prescription pain medicine.  Do not lift anything that is heavier than 10 lb (4.5 kg) until your health care provider approves.  Do not play contact sports until your health care provider approves.  Do not drive for 24 hours if you were given a medicine to help you relax (sedative).  Rest as needed. Do not return to work or school until your health care provider approves. General instructions   Take over-the-counter and prescription medicines only as told by your health care provider.  To prevent or treat constipation while you are taking prescription pain medicine, your health care provider may recommend that you:  Drink enough fluid to keep your urine clear or pale yellow.  Take over-the-counter or prescription medicines.  Eat foods that are high in fiber, such as fresh fruits and vegetables, whole grains, and beans.  Limit foods that are high in fat and processed sugars, such as fried and sweet foods. Contact a health care provider if:  You develop a rash.  You have more redness, swelling, or pain around your incisions.  You have more fluid or blood coming from your incisions.  Your incisions feel warm to the touch.  You have pus or a bad smell coming from your incisions.  You have a fever.  One or more of your incisions breaks open. Get help right away if:  You have trouble breathing.  You have chest pain.  You have increasing pain in your shoulders.  You faint or feel dizzy when you stand.  You have severe pain in your  abdomen.  You have nausea or vomiting that lasts for more than one day.  You have leg pain. This information is not intended to replace advice given to you by your health care provider. Make sure you discuss any questions you have with your health care provider. Document Released: 04/11/2005 Document Revised: 10/31/2015 Document Reviewed: 09/28/2015 Elsevier Interactive Patient Education  2017 [employer]  General Anesthesia, Adult General anesthesia is the use of medicines to make a person "go to sleep" (be unconscious) for a medical procedure. General anesthesia is often recommended when a procedure:  Is long.  Requires you to be still or in an unusual position.  Is major and can cause you to lose blood.  Is impossible to do without general anesthesia. The medicines used for general anesthesia are called general anesthetics. In addition to making you sleep, the medicines:  Prevent pain.  Control your blood pressure.  Relax your muscles. Tell a health care provider about:  Any allergies you have.  All medicines you are taking, including vitamins, herbs, eye drops, creams, and over-the-counter medicines.  Any problems you or family members have had with anesthetic medicines.  Types of anesthetics you have had in the past.  Any bleeding disorders you have.  Any surgeries you have had.  Any medical conditions you have.  Any history of heart or lung conditions, such as heart failure, sleep apnea, or chronic obstructive pulmonary disease (COPD).  Whether you are pregnant or may be pregnant.  Whether you use tobacco, alcohol, marijuana, or street drugs.  Any history of Financial [employer].  Any history of depression or anxiety. What are the risks? Generally, this is a safe procedure. However, problems may occur, including:  Allergic reaction to anesthetics.  Lung and heart problems.  Inhaling food or liquids from your stomach into your lungs  (aspiration).  Injury to nerves.  Waking up during your procedure and being unable to move (rare).  Extreme agitation or a state of mental confusion (delirium) when you wake up from the anesthetic.  Air in the bloodstream, which can lead to stroke. These problems are more likely to develop if you are having a major surgery or if you have an advanced medical condition. You can prevent some of these complications by answering all of your health care provider's questions thoroughly and by following all pre-procedure instructions. General anesthesia can cause side effects, including:  Nausea or vomiting  A sore throat from the breathing tube.  Feeling cold or shivery.  Feeling tired, washed out, or achy.  Sleepiness or drowsiness.  Confusion or agitation. What happens before the procedure? Staying hydrated  Follow instructions from your health care provider about hydration, which may include:  Up to 2 hours before the procedure - you may continue to drink clear liquids, such as water, clear fruit juice, black coffee, and plain tea. Eating and drinking restrictions  Follow instructions from your health care provider about eating and drinking, which may include:  8 hours before the procedure - stop eating heavy meals or foods such as meat, fried foods, or fatty foods.  6 hours before the procedure - stop eating light meals or foods, such as toast or cereal.  6 hours before the procedure - stop drinking milk or drinks that contain milk.  2 hours before the procedure - stop drinking clear liquids. Medicines   Ask your health care provider about:  Changing or stopping your regular medicines. This is especially important if you are taking diabetes medicines or blood thinners.  Taking medicines such as aspirin and ibuprofen. These medicines can thin your blood. Do not take these medicines before your procedure if your health care provider instructs you not to.  Taking new dietary  supplements or medicines. Do not take these during the week before your procedure unless your health care provider approves them.  If you are told to take a medicine or to continue taking a medicine on the day of the procedure, take the medicine with sips of water. General instructions    Ask if you will be going home the same day, the following day, or after a longer hospital stay.  Plan  to have someone take you home.  Plan to have someone stay with you for the first 24 hours after you leave the hospital or clinic.  For 3-6 weeks before the procedure, try not to use any tobacco products, such as cigarettes, chewing tobacco, and e-cigarettes.  You may brush your teeth on the morning of the procedure, but make sure to spit out the toothpaste. What happens during the procedure?  You will be given anesthetics through a mask and through an IV tube in one of your veins.  You may receive medicine to help you relax (sedative).  As soon as you are asleep, a breathing tube may be used to help you breathe.  An anesthesia specialist will stay with you throughout the procedure. He or she will help keep you comfortable and safe by continuing to give you medicines and adjusting the amount of medicine that you get. He or she will also watch your blood pressure, pulse, and oxygen levels to make sure that the anesthetics do not cause any problems.  If a breathing tube was used to help you breathe, it will be removed before you wake up. The procedure may vary among health care providers and hospitals. What happens after the procedure?  You will wake up, often slowly, after the procedure is complete, usually in a recovery area.  Your blood pressure, heart rate, breathing rate, and blood oxygen level will be monitored until the medicines you were given have worn off.  You may be given medicine to help you calm down if you feel anxious or agitated.  If you will be going home the same day, your health  care provider may check to make sure you can stand, drink, and urinate.  Your health care providers will treat your pain and side effects before you go home.  Do not drive for 24 hours if you received a sedative.  You may:  Feel nauseous and vomit.  Have a sore throat.  Have mental slowness.  Feel cold or shivery.  Feel sleepy.  Feel tired.  Feel sore or achy, even in parts of your body where you did not have surgery. This information is not intended to replace advice given to you by your health care provider. Make sure you discuss any questions you have with your health care provider. Document Released: 07/19/2007 Document Revised: 09/22/2015 Document Reviewed: 03/26/2015 Elsevier Interactive Patient Education  2017 [employer] Inc. General Anesthesia, Adult, Care After These instructions provide you with information about caring for yourself after your procedure. Your health care provider may also give you more specific instructions. Your treatment has been planned according to current medical practices, but problems sometimes occur. Call your health care provider if you have any problems or questions after your procedure. What can I expect after the procedure? After the procedure, it is common to have:  Vomiting.  A sore throat.  Mental slowness. It is common to feel:  Nauseous.  Cold or shivery.  Sleepy.  Tired.  Sore or achy, even in parts of your body where you did not have surgery. Follow these instructions at home: For at least 24 hours after the procedure:   Do not:  Participate in activities where you could fall or become injured.  Drive.  Use heavy machinery.  Drink alcohol.  Take sleeping pills or medicines that cause drowsiness.  Make important decisions or sign legal documents.  Take care of children on your own.  Rest. Eating and drinking   If you vomit,  drink water, juice, or soup when you can drink without vomiting.  Drink enough  fluid to keep your urine clear or pale yellow.  Make sure you have little or no nausea before eating solid foods.  Follow the diet recommended by your health care provider. General instructions   Have a responsible adult stay with you until you are awake and alert.  Return to your normal activities as told by your health care provider. Ask your health care provider what activities are safe for you.  Take over-the-counter and prescription medicines only as told by your health care provider.  If you smoke, do not smoke without supervision.  Keep all follow-up visits as told by your health care provider. This is important. Contact a health care provider if:  You continue to have nausea or vomiting at home, and medicines are not helpful.  You cannot drink fluids or start eating again.  You cannot urinate after 8-12 hours.  You develop a skin rash.  You have fever.  You have increasing redness at the site of your procedure. Get help right away if:  You have difficulty breathing.  You have chest pain.  You have unexpected bleeding.  You feel that you are having a life-threatening or urgent problem. This information is not intended to replace advice given to you by your health care provider. Make sure you discuss any questions you have with your health care provider. Document Released: 07/18/2000 Document Revised: 09/14/2015 Document Reviewed: 03/26/2015 Elsevier Interactive Patient Education  2017 [employer].

## 2016-07-28 ENCOUNTER — Encounter (HOSPITAL_COMMUNITY)
Admission: RE | Admit: 2016-07-28 | Discharge: 2016-07-28 | Disposition: A | Discharge status: Home / Self Care | Payer: Medicare Other | Source: Ambulatory Visit | Attending: General Surgery | Admitting: General Surgery

## 2016-07-28 ENCOUNTER — Encounter (HOSPITAL_COMMUNITY): Payer: Self-pay

## 2016-07-28 DIAGNOSIS — I2510 Atherosclerotic heart disease of native coronary artery without angina pectoris: Secondary | ICD-10-CM | POA: Insufficient documentation

## 2016-07-28 DIAGNOSIS — E785 Hyperlipidemia, unspecified: Secondary | ICD-10-CM | POA: Diagnosis not present

## 2016-07-28 DIAGNOSIS — N182 Chronic kidney disease, stage 2 (mild): Secondary | ICD-10-CM | POA: Insufficient documentation

## 2016-07-28 DIAGNOSIS — E1122 Type 2 diabetes mellitus with diabetic chronic kidney disease: Secondary | ICD-10-CM | POA: Insufficient documentation

## 2016-07-28 DIAGNOSIS — I4891 Unspecified atrial fibrillation: Secondary | ICD-10-CM | POA: Diagnosis not present

## 2016-07-28 DIAGNOSIS — Z01812 Encounter for preprocedural laboratory examination: Secondary | ICD-10-CM | POA: Diagnosis present

## 2016-07-28 DIAGNOSIS — I129 Hypertensive chronic kidney disease with stage 1 through stage 4 chronic kidney disease, or unspecified chronic kidney disease: Secondary | ICD-10-CM | POA: Diagnosis not present

## 2016-07-28 LAB — CBC WITH DIFFERENTIAL/PLATELET
Basophils Absolute: 0.1 10*3/uL (ref 0.0–0.1)
Basophils Relative: 1 %
EOS PCT: 5 %
Eosinophils Absolute: 0.3 10*3/uL (ref 0.0–0.7)
HCT: 35.1 % — ABNORMAL LOW (ref 39.0–52.0)
Hemoglobin: 11.9 g/dL — ABNORMAL LOW (ref 13.0–17.0)
LYMPHS PCT: 26 %
Lymphs Abs: 2 10*3/uL (ref 0.7–4.0)
MCH: 29.2 pg (ref 26.0–34.0)
MCHC: 33.9 g/dL (ref 30.0–36.0)
MCV: 86 fL (ref 78.0–100.0)
MONOS PCT: 7 %
Monocytes Absolute: 0.5 10*3/uL (ref 0.1–1.0)
Neutro Abs: 4.5 10*3/uL (ref 1.7–7.7)
Neutrophils Relative %: 61 %
PLATELETS: 238 10*3/uL (ref 150–400)
RBC: 4.08 MIL/uL — ABNORMAL LOW (ref 4.22–5.81)
RDW: 14.5 % (ref 11.5–15.5)
WBC: 7.4 10*3/uL (ref 4.0–10.5)

## 2016-07-28 LAB — BASIC METABOLIC PANEL
Anion gap: 7 (ref 5–15)
BUN: 28 mg/dL — AB (ref 6–20)
CO2: 26 mmol/L (ref 22–32)
Calcium: 9.6 mg/dL (ref 8.9–10.3)
Chloride: 104 mmol/L (ref 101–111)
Creatinine, Ser: 1.67 mg/dL — ABNORMAL HIGH (ref 0.61–1.24)
GFR calc Af Amer: 45 mL/min — ABNORMAL LOW (ref >60)
GFR calc non Af Amer: 38 mL/min — ABNORMAL LOW (ref >60)
GLUCOSE: 165 mg/dL — AB (ref 65–99)
POTASSIUM: 4.6 mmol/L (ref 3.5–5.1)
Sodium: 137 mmol/L (ref 135–145)

## 2016-07-28 LAB — HEPATIC FUNCTION PANEL
ALBUMIN: 4.1 g/dL (ref 3.5–5.0)
ALT: 21 U/L (ref 17–63)
AST: 21 U/L (ref 15–41)
Alkaline Phosphatase: 78 U/L (ref 38–126)
Bilirubin, Direct: 0.1 mg/dL (ref 0.1–0.5)
Indirect Bilirubin: 0.6 mg/dL (ref 0.3–0.9)
TOTAL PROTEIN: 7.2 g/dL (ref 6.5–8.1)
Total Bilirubin: 0.7 mg/dL (ref 0.3–1.2)

## 2016-08-02 ENCOUNTER — Ambulatory Visit: Payer: Medicare Other | Admitting: Internal Medicine

## 2016-08-04 ENCOUNTER — Encounter (HOSPITAL_COMMUNITY): Payer: Self-pay | Admitting: Anesthesiology

## 2016-08-05 ENCOUNTER — Encounter (HOSPITAL_COMMUNITY): Admission: RE | Payer: Self-pay | Source: Ambulatory Visit

## 2016-08-05 ENCOUNTER — Ambulatory Visit (HOSPITAL_COMMUNITY): Admission: RE | Admit: 2016-08-05 | Payer: Medicare Other | Source: Ambulatory Visit | Admitting: General Surgery

## 2016-08-05 SURGERY — LAPAROSCOPIC CHOLECYSTECTOMY
Anesthesia: General

## 2016-08-18 HISTORY — PX: CORONARY ANGIOPLASTY WITH STENT PLACEMENT: SHX49

## 2016-12-08 ENCOUNTER — Encounter: Payer: Self-pay | Admitting: Internal Medicine

## 2017-02-07 ENCOUNTER — Ambulatory Visit (INDEPENDENT_AMBULATORY_CARE_PROVIDER_SITE_OTHER): Payer: Medicare Other | Admitting: Nurse Practitioner

## 2017-02-07 ENCOUNTER — Encounter: Payer: Self-pay | Admitting: Nurse Practitioner

## 2017-02-07 VITALS — BP 124/67 | HR 62 | Temp 97.8°F | Ht 65.0 in | Wt 181.8 lb

## 2017-02-07 DIAGNOSIS — K5909 Other constipation: Secondary | ICD-10-CM

## 2017-02-07 DIAGNOSIS — K649 Unspecified hemorrhoids: Secondary | ICD-10-CM | POA: Diagnosis not present

## 2017-02-07 NOTE — Patient Instructions (Signed)
1. Take Colace 100 mg once a day to twice a day for constipation. 2. If you're taking Colace twice a day and drinking adequate water but still having constipation symptoms you can add MiraLAX powder 1-2 times a day as needed. 3. We will contact her cardiologist. Please call us with your cardiologist name so we can get a hold of them.  4. We will discuss options with them in relation to your recent heart stent and chronic blood thinners. 5. Return for follow-up in 2 months. 6. Call us if you have any questions or concerns.

## 2017-02-07 NOTE — Assessment & Plan Note (Addendum)
Persistent hemorrhoids with worsening symptoms including more frequent leakage and foul odor. His hemorrhoids protrude now after every bowel movement. Has not tolerated constipation medications including Linzess and Amitiza. Leading occurs after constipated bowel movement. At this point I will have him try Colace 1-2 a day and add MiraLAX once to twice a day as needed for persistent hard stools. We will contact cardiology to discuss his chronic anticoagulation and Plavix as well as recent stenting as far as candidacy for possible hemorrhoid banding which would necessitate holding anticoagulation for a minimum 2 weeks versus referral to surgery for hemorrhoidectomy. Return for follow-up in 2 months. Continue prescription Anusol rectal cream.

## 2017-02-07 NOTE — Assessment & Plan Note (Signed)
Constipation stable and persistent. Not tolerant of constipation medications. We'll start Colace 1-2 a day and MiraLAX once to twice a day as needed. Return for follow-up in 2 months.

## 2017-02-07 NOTE — Progress Notes (Signed)
CC'D TO PCP °

## 2017-02-07 NOTE — Progress Notes (Signed)
Referring Provider: Eusebio Me* Primary Care Physician:  Isabella Bowens, MD Primary GI:  Dr. Jena Gauss  Chief Complaint  Patient presents with  . Follow-up    hemorrhoids-currently out    HPI:   Johnathan Chavez is a 76 y.o. male who presents for follow-up on hemorrhoids. The patient was last seen in our office 07/21/2016 for constipation, bleeding hemorrhoid, acute biliary pancreatitis without infection or necrosis. Colonoscopy up-to-date 2016 which found internal hemorrhoids.   Reviewed CT completed 2018 which found no radiographic evidence of pancreatitis, pancreatic mass, or other acute findings. Previous issues with common bile duct stone and was supposed to have a cholecystectomy in April.  Reviewed last hepatic function panel completed 07/28/2016 which found normal liver function/transaminases.  At his last visit he was doing well overall. Still has significant hemorrhoids after bowel movements and subsequent anal leakage requiring pads. Rectal cream is not improved his symptoms. Not a candidate for banding due to chronic anticoagulation. Occasional rectal bleeding. No other GI symptoms.  Today he states he's ok overall. If he takes Linzess he has diarrhea which causes hemorrhoid bleeding so he's not taking it. Unsure if he's tried Amitiza. Hemorrhoids protrude after every bowel movement and remain out for a time with odor, anal leakage. Denies abdominal pain. His laparoscopic cholecystectomy did not occur because "every doctor I saw had a different opinion." However, no biliary issues since. Will have rectal bleeding minimally with constipation. Denies chest pain, dyspnea, dizziness, lightheadedness, syncope, near syncope. Denies any other upper or lower GI symptoms.   Cannot remember his cardiologist's name in [place], will call with that information.   Past Medical History:  Diagnosis Date  . A-fib (HCC)   . CAD (coronary artery disease)   . CKD (chronic  kidney disease) stage 2, GFR 60-89 ml/min   . GERD (gastroesophageal reflux disease)   . HTN (hypertension)   . Hyperlipidemia   . 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 APPENDECTOMY    . BACK SURGERY     X2  . CERVICAL DISC SURGERY    . COLONOSCOPY  04/2014   [facility]: moderate sized internal hemorrhoids, 2 tubular adenomas removed from colon. diverticulosis  . CORONARY ANGIOPLASTY WITH STENT PLACEMENT  08/18/2016   In a hospital in [place]  . CORONARY ARTERY BYPASS GRAFT  2001  . ESOPHAGOGASTRODUODENOSCOPY  01/2015   Dr. Allena Katz: gastritis  . INGUINAL HERNIA REPAIR Left   . SHOULDER ARTHROSCOPY Right   . SIGMOIDOSCOPY  01/2015   Dr. Allena Katz: internal hemorrhoids  . TONSILLECTOMY      Current Outpatient Prescriptions  Medication Sig Dispense Refill  . amLODipine (NORVASC) 10 MG tablet Take 10 mg by mouth daily.  0  . apixaban (ELIQUIS) 5 MG TABS tablet Take 5 mg by mouth daily.     . Ascorbic Acid (VITAMIN C) 1000 MG tablet Take 1,000 mg by mouth daily.     . calcium-vitamin D (SM CALCIUM 500/VITAMIN D3) 500-400 MG-UNIT tablet Take 1 tablet by mouth daily.     . clopidogrel (PLAVIX) 75 MG tablet Take 75 mg by mouth daily.    . Cyanocobalamin (VITAMIN B 12 PO) Take 1,000 mg by mouth daily.    . diazepam (VALIUM) 5 MG tablet Take 5 mg by mouth 3 (three) times daily.    Marland Kitchen escitalopram (LEXAPRO) 10 MG tablet Take 10 mg by mouth daily.    Marland Kitchen  ferrous gluconate (IRON 27) 240 (27 FE) MG tablet Take 240 mg by mouth 2 (two) times a week.    . gabapentin (NEURONTIN) 300 MG capsule Take 300 mg by mouth 2 (two) times daily.     . Ginkgo Biloba 60 MG TABS Take 60 mg by mouth daily.    Marland Kitchen glipiZIDE (GLUCOTROL) 10 MG tablet Take 10 mg by mouth 3 (three) times daily.    . Glucosamine-Chondroit-Vit C-Mn (GLUCOSAMINE 1500 COMPLEX PO) Take 1,500 mg by mouth daily.    Marland Kitchen lisinopril (PRINIVIL,ZESTRIL) 20 MG tablet Take 20  mg by mouth daily.     . Multiple Vitamin (MULTIVITAMIN WITH MINERALS) TABS tablet Take 1 tablet by mouth daily.    . Multiple Vitamins-Minerals (PRESERVISION AREDS 2 PO) Take 1 capsule by mouth 2 (two) times daily.    . Omega-3 Fatty Acids (FISH OIL) 1200 MG CAPS Take 1,200 mg by mouth daily.    . pantoprazole (PROTONIX) 20 MG tablet Take 20 mg by mouth 2 (two) times daily.    . Probiotic Product (RA PROBIOTIC GUMMIES) CHEW Chew by mouth.    . saxagliptin HCl (ONGLYZA) 5 MG TABS tablet Take 5 mg by mouth daily.    . simvastatin (ZOCOR) 40 MG tablet Take 40 mg by mouth at bedtime.     . Zinc 50 MG TABS Take by mouth. Twice per week    . linaclotide (LINZESS) 72 MCG capsule Take 1 capsule (72 mcg total) by mouth daily before breakfast. You can cut back to every other day if need be. (Patient not taking: Reported on 02/07/2017) 30 capsule 5  . omeprazole (PRILOSEC) 20 MG capsule Take 20 mg by mouth 2 (two) times daily before a meal.      No current facility-administered medications for this visit.     Allergies as of 02/07/2017 - Review Complete 02/07/2017  Allergen Reaction Noted  . Codeine Nausea Only 02/19/2015    Family History  Problem Relation Age of Onset  . Diabetes Mother   . Colon cancer Father        ?colon cancer, age early 66    Social History   Social History  . Marital status: Married    Spouse name: N/A  . Number of children: N/A  . Years of education: N/A   Social History Main Topics  . Smoking status: Never Smoker  . Smokeless tobacco: Never Used  . Alcohol use No  . Drug use: No  . Sexual activity: Not Asked   Other Topics Concern  . None   Social History Narrative  . None    Review of Systems: General: Negative for anorexia, weight loss, fever, chills, fatigue, weakness. ENT: Negative for hoarseness, difficulty swallowing. CV: Negative for chest pain, angina, palpitations, peripheral edema.  Respiratory: Negative for dyspnea at rest, cough,  sputum, wheezing.  GI: See history of present illness. Endo: Negative for unusual weight change.  Heme: Negative for bruising or bleeding. Allergy: Negative for rash or hives.   Physical Exam: BP 124/67   Pulse 62   Temp 97.8 F (36.6 C) (Oral)   Ht  (1.651 m)   Wt 181 lb 12.8 oz (82.5 kg)   BMI 30.25 kg/m  General:   Alert and oriented. Pleasant and cooperative. Well-nourished and well-developed.  Eyes:  Without icterus, sclera clear and conjunctiva pink.  Ears:  Normal auditory acuity. Cardiovascular:  S1, S2 present without murmurs appreciated.Extremities without clubbing or edema. Respiratory:  Clear to auscultation bilaterally. No  wheezes, rales, or rhonchi. No distress.  Gastrointestinal:  +BS, soft, non-tender and non-distended. No HSM noted. No guarding or rebound. No masses appreciated.  Rectal:  Deferred  Musculoskalatal:  Symmetrical without gross deformities. Neurologic:  Alert and oriented x4;  grossly normal neurologically. Psych:  Alert and cooperative. Normal mood and affect. Heme/Lymph/Immune: No excessive bruising noted.    02/07/2017 8:29 AM   Disclaimer: This note was dictated with voice recognition software. Similar sounding words can inadvertently be transcribed and may not be corrected upon review.

## 2017-03-23 ENCOUNTER — Ambulatory Visit: Payer: Medicare Other | Admitting: Gastroenterology

## 2017-04-10 ENCOUNTER — Ambulatory Visit (INDEPENDENT_AMBULATORY_CARE_PROVIDER_SITE_OTHER): Payer: Medicare Other | Admitting: Nurse Practitioner

## 2017-04-10 ENCOUNTER — Encounter: Payer: Self-pay | Admitting: Nurse Practitioner

## 2017-04-10 ENCOUNTER — Other Ambulatory Visit: Payer: Self-pay | Admitting: *Deleted

## 2017-04-10 VITALS — BP 126/68 | HR 73 | Temp 97.1°F | Ht 65.0 in | Wt 177.0 lb

## 2017-04-10 DIAGNOSIS — K649 Unspecified hemorrhoids: Secondary | ICD-10-CM

## 2017-04-10 DIAGNOSIS — K5909 Other constipation: Secondary | ICD-10-CM

## 2017-04-10 DIAGNOSIS — K219 Gastro-esophageal reflux disease without esophagitis: Secondary | ICD-10-CM

## 2017-04-10 MED ORDER — HYDROCORTISONE 2.5 % RE CREA: 1.0000 "application " | TOPICAL_CREAM | Freq: Two times a day (BID) | RECTAL | 1 refills | Status: AC

## 2017-04-10 NOTE — Assessment & Plan Note (Signed)
Constipation is doing well on over-the-counter Colace every 2-3 days.  His stools are soft/loose when he takes these.  Continue current medications, and to prevent constipation and worsening of hemorrhoids.  Follow-up in 6 months.

## 2017-04-10 NOTE — Assessment & Plan Note (Signed)
GERD symptoms doing well today.  Continue PPI.

## 2017-04-10 NOTE — Patient Instructions (Signed)
1. I am sending him Anusol rectal cream to your pharmacy.  You can apply this up to twice a day, for up to 10 days at a time. 2. We will refer you to a surgeon to discuss removing her hemorrhoids. 3. Return for follow-up in 6 months. 4. Call if you have any questions or concerns.

## 2017-04-10 NOTE — Assessment & Plan Note (Signed)
Noted hemorrhoids which appear to be worsening.  At the very least, the patient is going increasingly frustrated with the hemorrhoids.  The result in bleeding, anal leakage, foul odor and require him to wear pads after having a bowel movement.  These tend to protrude and remain protruded for about 1 hour.  I will send in Anusol rectal cream to help symptomatically for the time being.  He is requesting referral to general surgery.  We will put this referral in for him.  He is on chronic anticoagulation and had a stent placed in April 2018.  Due to Eliquis for A. fib he is not a banding candidate at this time.  Return for follow-up in 6 months

## 2017-04-10 NOTE — Progress Notes (Signed)
CC'ED TO PCP 

## 2017-04-10 NOTE — Progress Notes (Signed)
Referring Provider: Eusebio MeVasireddy, Venugopal Ki* Primary Care Physician:  Isabella BowensVasireddy, Venugopal Kiran, MD Primary GI:  Dr. Jena Gaussourk  Chief Complaint  Patient presents with  . Hemorrhoids    bleeding    HPI:   Johnathan Chavez is a 76 y.o. male who presents for follow-up on hemorrhoids.  The patient was last seen in our office 02/07/2017 for constipation and bleeding hemorrhoids.  Colonoscopy up-to-date 2016 which found internal hemorrhoids.  History of acute biliary pancreatitis without infection or necrosis with CT completed in 2018 with no radiographic evidence of pancreatitis, pancreatic mass, or other acute findings.  At his last visit he was doing okay overall.  Linzess daily causes diarrhea and hemorrhoidal bleeding, hemorrhoids protrude after every bowel movement and remain protruded with odor and anal leakage.  No abdominal pain.  He ended up not having a laparoscopic cholecystectomy because "every doctor I saw had a different opinion."  No recurrent biliary issues.  Recommended Colace 1-2 times a day, MiraLAX 1-2 times a day as needed.  Consider possible hemorrhoid banding versus surgical hemorrhoidectomy if persistent symptoms.  Last hepatic function panel completed July 28, 2016 was normal.  No new abdominal imaging other than as reviewed above.  Today he states he's ok overall. Still with bleeding hemorrhoids. When he has a bowel movement they protrude and cause bleeding, remain protruded and has anal leakage and hematochezia with significant odor. He is wanting a referral to general surgery for hemorrhoidectomy. Chronic abdominal pain but no worse then normal. Constipation is resolved on Colace every few days. GERD well controlled at this time. Denies melena. Denies chest pain, dyspnea, dizziness, lightheadedness, syncope, near syncope. Denies any other upper or lower GI symptoms.   Outside colonoscopy report reviewed. Two polyps one found to be tubular adenoma, family history of colon  ca recommend repeat in 5 years per previous endoscopist (2021).  Past Medical History:  Diagnosis Date  . A-fib (HCC)   . CAD (coronary artery disease)   . CKD (chronic kidney disease) stage 2, GFR 60-89 ml/min   . GERD (gastroesophageal reflux disease)   . HTN (hypertension)   . Hyperlipidemia   . 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 APPENDECTOMY    . BACK SURGERY     X2  . CERVICAL DISC SURGERY    . COLONOSCOPY  04/2014   [facility]: moderate sized internal hemorrhoids, 2 tubular adenomas removed from colon. diverticulosis  . CORONARY ANGIOPLASTY WITH STENT PLACEMENT  08/18/2016   [place]  . CORONARY ARTERY BYPASS GRAFT  2001  . ESOPHAGOGASTRODUODENOSCOPY  01/2015   Dr. Allena KatzPatel: gastritis  . INGUINAL HERNIA REPAIR Left   . SHOULDER ARTHROSCOPY Right   . SIGMOIDOSCOPY  01/2015   Dr. Allena KatzPatel: internal hemorrhoids  . TONSILLECTOMY      Current Outpatient Medications  Medication Sig Dispense Refill  . amLODipine (NORVASC) 10 MG tablet Take 10 mg by mouth daily.  0  . apixaban (ELIQUIS) 5 MG TABS tablet Take 5 mg by mouth daily.     . Ascorbic Acid (VITAMIN C) 1000 MG tablet Take 1,000 mg by mouth daily.     . calcium-vitamin D (SM CALCIUM 500/VITAMIN D3) 500-400 MG-UNIT tablet Take 1 tablet by mouth daily.     . clopidogrel (PLAVIX) 75 MG tablet Take 75 mg by mouth daily.    . Cyanocobalamin (VITAMIN B 12 PO)  Take 5,000 mg by mouth daily.     . ferrous gluconate (IRON 27) 240 (27 FE) MG tablet Take 240 mg by mouth 2 (two) times a week.    . gabapentin (NEURONTIN) 300 MG capsule Take 300 mg by mouth 2 (two) times daily.     . Ginkgo Biloba 60 MG TABS Take 60 mg by mouth daily.    Marland Kitchen. glipiZIDE (GLUCOTROL) 10 MG tablet Take 10 mg by mouth 3 (three) times daily.    . Glucosamine-Chondroit-Vit C-Mn (GLUCOSAMINE 1500 COMPLEX PO) Take 1,500 mg by mouth daily.    Marland Kitchen. linaclotide (LINZESS) 72  MCG capsule Take 1 capsule (72 mcg total) by mouth daily before breakfast. You can cut back to every other day if need be. (Patient taking differently: Take 72 mcg by mouth as needed. You can cut back to every other day if need be.) 30 capsule 5  . lisinopril (PRINIVIL,ZESTRIL) 20 MG tablet Take 20 mg by mouth daily.     . Multiple Vitamin (MULTIVITAMIN WITH MINERALS) TABS tablet Take 1 tablet by mouth daily.    . Multiple Vitamins-Minerals (PRESERVISION AREDS 2 PO) Take 1 capsule by mouth 2 (two) times daily.    . Omega-3 Fatty Acids (FISH OIL) 1200 MG CAPS Take 1,200 mg by mouth daily.    Marland Kitchen. omeprazole (PRILOSEC) 20 MG capsule Take 20 mg by mouth 2 (two) times daily before a meal.     . Probiotic Product (RA PROBIOTIC GUMMIES) CHEW Chew by mouth.    . saxagliptin HCl (ONGLYZA) 5 MG TABS tablet Take 5 mg by mouth daily.    . simvastatin (ZOCOR) 40 MG tablet Take 40 mg by mouth at bedtime.     . Zinc 50 MG TABS Take by mouth. Twice per week    . hydrocortisone (ANUSOL-HC) 2.5 % rectal cream Place 1 application rectally 2 (two) times daily. For up to 10 days at a time. 30 g 1   No current facility-administered medications for this visit.     Allergies as of 04/10/2017 - Review Complete 04/10/2017  Allergen Reaction Noted  . Codeine Nausea Only 02/19/2015    Family History  Problem Relation Age of Onset  . Diabetes Mother   . Colon cancer Father        ?colon cancer, age early 4170    Social History   Socioeconomic History  . Marital status: Married    Spouse name: None  . Number of children: None  . Years of education: None  . Highest education level: None  Social Needs  . Financial resource strain: None  . Food insecurity - worry: None  . Food insecurity - inability: None  . Transportation needs - medical: None  . Transportation needs - non-medical: None  Occupational History  . None  Tobacco Use  . Smoking status: Never Smoker  . Smokeless tobacco: Never Used  Substance  and Sexual Activity  . Alcohol use: No  . Drug use: No  . Sexual activity: None  Other Topics Concern  . None  Social History Narrative  . None    Review of Systems: General: Negative for anorexia, weight loss, fever, chills, fatigue, weakness. ENT: Negative for hoarseness, difficulty swallowing. CV: Negative for chest pain, angina, palpitations, peripheral edema.  Respiratory: Negative for dyspnea at rest, cough, sputum, wheezing.  GI: See history of present illness. Endo: Negative for unusual weight change.  Heme: Negative for bruising or bleeding.   Physical Exam: BP 126/68   Pulse 73  Temp (!) 97.1 F (36.2 C) (Oral)   Ht 5\' 5"  (1.651 m)   Wt 177 lb (80.3 kg)   BMI 29.45 kg/m  General:   Alert and oriented. Pleasant and cooperative. Well-nourished and well-developed.  Eyes:  Without icterus, sclera clear and conjunctiva pink.  Ears:  Normal auditory acuity. Cardiovascular:  S1, S2 present without murmurs appreciated. Extremities without clubbing or edema. Respiratory:  Clear to auscultation bilaterally. No wheezes, rales, or rhonchi. No distress.  Gastrointestinal:  +BS, soft, non-tender and non-distended. No HSM noted. No guarding or rebound. No masses appreciated.  Rectal:  Deferred  Musculoskalatal:  Symmetrical without gross deformities. Neurologic:  Alert and oriented x4;  grossly normal neurologically. Psych:  Alert and cooperative. Normal mood and affect. Heme/Lymph/Immune: No excessive bruising noted.    04/10/2017 8:53 AM   Disclaimer: This note was dictated with voice recognition software. Similar sounding words can inadvertently be transcribed and may not be corrected upon review.

## 2017-04-13 ENCOUNTER — Ambulatory Visit (INDEPENDENT_AMBULATORY_CARE_PROVIDER_SITE_OTHER): Payer: Medicare Other | Admitting: General Surgery

## 2017-04-13 ENCOUNTER — Encounter: Payer: Self-pay | Admitting: General Surgery

## 2017-04-13 VITALS — BP 159/70 | HR 67 | Temp 97.8°F | Ht 65.0 in | Wt 177.0 lb

## 2017-04-13 DIAGNOSIS — K649 Unspecified hemorrhoids: Secondary | ICD-10-CM

## 2017-04-13 NOTE — Patient Instructions (Signed)
Hemorrhoids Hemorrhoids are swollen veins in and around the rectum or anus. There are two types of hemorrhoids:  Internal hemorrhoids. These occur in the veins that are just inside the rectum. They may poke through to the outside and become irritated and painful.  External hemorrhoids. These occur in the veins that are outside of the anus and can be felt as a painful swelling or hard lump near the anus.  Most hemorrhoids do not cause serious problems, and they can be managed with home treatments such as diet and lifestyle changes. If home treatments do not help your symptoms, procedures can be done to shrink or remove the hemorrhoids. What are the causes? This condition is caused by increased pressure in the anal area. This pressure may result from various things, including:  Constipation.  Straining to have a bowel movement.  Diarrhea.  Pregnancy.  Obesity.  Sitting for long periods of time.  Heavy lifting or other activity that causes you to strain.  Anal sex.  What are the signs or symptoms? Symptoms of this condition include:  Pain.  Anal itching or irritation.  Rectal bleeding.  Leakage of stool (feces).  Anal swelling.  One or more lumps around the anus.  How is this diagnosed? This condition can often be diagnosed through a visual exam. Other exams or tests may also be done, such as:  Examination of the rectal area with a gloved hand (digital rectal exam).  Examination of the anal canal using a small tube (anoscope).  A blood test, if you have lost a significant amount of blood.  A test to look inside the colon (sigmoidoscopy or colonoscopy).  How is this treated? This condition can usually be treated at home. However, various procedures may be done if dietary changes, lifestyle changes, and other home treatments do not help your symptoms. These procedures can help make the hemorrhoids smaller or remove them completely. Some of these procedures involve  surgery, and others do not. Common procedures include:  Rubber band ligation. Rubber bands are placed at the base of the hemorrhoids to cut off the blood supply to them.  Sclerotherapy. Medicine is injected into the hemorrhoids to shrink them.  Infrared coagulation. A type of light energy is used to get rid of the hemorrhoids.  Hemorrhoidectomy surgery. The hemorrhoids are surgically removed, and the veins that supply them are tied off.  Stapled hemorrhoidopexy surgery. A circular stapling device is used to remove the hemorrhoids and use staples to cut off the blood supply to them.  Follow these instructions at home: Eating and drinking  Eat foods that have a lot of fiber in them, such as whole grains, beans, nuts, fruits, and vegetables. Ask your health care provider about taking products that have added fiber (fiber supplements).  Drink enough fluid to keep your urine clear or pale yellow. Managing pain and swelling  Take warm sitz baths for 20 minutes, 3-4 times a day to ease pain and discomfort.  If directed, apply ice to the affected area. Using ice packs between sitz baths may be helpful. ? Put ice in a plastic bag. ? Place a towel between your skin and the bag. ? Leave the ice on for 20 minutes, 2-3 times a day. General instructions  Take over-the-counter and prescription medicines only as told by your health care provider.  Use medicated creams or suppositories as told.  Exercise regularly.  Go to the bathroom when you have the urge to have a bowel movement. Do not wait.    Avoid straining to have bowel movements.  Keep the anal area dry and clean. Use wet toilet paper or moist towelettes after a bowel movement.  Do not sit on the toilet for long periods of time. This increases blood pooling and pain. Contact a health care provider if:  You have increasing pain and swelling that are not controlled by treatment or medicine.  You have uncontrolled bleeding.  You  have difficulty having a bowel movement, or you are unable to have a bowel movement.  You have pain or inflammation outside the area of the hemorrhoids. This information is not intended to replace advice given to you by your health care provider. Make sure you discuss any questions you have with your health care provider. Document Released: 04/08/2000 Document Revised: 09/09/2015 Document Reviewed: 12/24/2014 Elsevier Interactive Patient Education  2018 [employer]  

## 2017-04-13 NOTE — Progress Notes (Signed)
Johnathan KannerWilliam R Chavez; [id number]; [birth year]   HPI Patient is a 76 year old white male who was referred to my care by rocking him to gastroenterology for evaluation and treatment of bleeding hemorrhoids.  Patient has had bleeding hemorrhoids intermittently for the past few months.  It does turn the toilet bowl dark red.  He has a bowel movement every several days.  He does take a stool softener.  He recently had coronary stent placement in April 2018 and is on both Xarelto and Plavix.  This was done in [place].  He does not know whether this is a drug-eluting stent.  He currently has 2 out of 10 pain.  He states the hemorrhoids prolapse out and he does push them back in.  He was seen by gastroenterology but they did not feel he was a candidate for banding. Past Medical History:  Diagnosis Date  . A-fib (HCC)   . CAD (coronary artery disease)   . CKD (chronic kidney disease) stage 2, GFR 60-89 ml/min   . GERD (gastroesophageal reflux disease)   . HTN (hypertension)   . Hyperlipidemia   . 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 APPENDECTOMY    . BACK SURGERY     X2  . CERVICAL DISC SURGERY    . COLONOSCOPY  04/2014   [facility]: moderate sized internal hemorrhoids, 2 tubular adenomas removed from colon. diverticulosis  . CORONARY ANGIOPLASTY WITH STENT PLACEMENT  08/18/2016   In a hospital in [place]  . CORONARY ARTERY BYPASS GRAFT  2001  . ESOPHAGOGASTRODUODENOSCOPY  01/2015   Dr. Allena KatzPatel: gastritis  . INGUINAL HERNIA REPAIR Left   . SHOULDER ARTHROSCOPY Right   . SIGMOIDOSCOPY  01/2015   Dr. Allena KatzPatel: internal hemorrhoids  . TONSILLECTOMY      Family History  Problem Relation Age of Onset  . Diabetes Mother   . Colon cancer Father        ?colon cancer, age early 970    Current Outpatient Medications on File Prior to Visit  Medication Sig Dispense Refill  . amLODipine (NORVASC) 10 MG tablet Take 10 mg by  mouth daily.  0  . apixaban (ELIQUIS) 5 MG TABS tablet Take 5 mg by mouth daily.     . Ascorbic Acid (VITAMIN C) 1000 MG tablet Take 1,000 mg by mouth daily.     . calcium-vitamin D (SM CALCIUM 500/VITAMIN D3) 500-400 MG-UNIT tablet Take 1 tablet by mouth daily.     . clopidogrel (PLAVIX) 75 MG tablet Take 75 mg by mouth daily.    . Cyanocobalamin (VITAMIN B 12 PO) Take 5,000 mg by mouth daily.     . ferrous gluconate (IRON 27) 240 (27 FE) MG tablet Take 240 mg by mouth 2 (two) times a week.    . gabapentin (NEURONTIN) 300 MG capsule Take 300 mg by mouth 2 (two) times daily.     . Ginkgo Biloba 60 MG TABS Take 60 mg by mouth daily.    Marland Kitchen. glipiZIDE (GLUCOTROL) 10 MG tablet Take 10 mg by mouth 3 (three) times daily.    . Glucosamine-Chondroit-Vit C-Mn (GLUCOSAMINE 1500 COMPLEX PO) Take 1,500 mg by mouth daily.    . hydrocortisone (ANUSOL-HC) 2.5 % rectal cream Place 1 application rectally 2 (two) times daily. For up to 10 days at a time. 30 g 1  . linaclotide (LINZESS) 72 MCG capsule Take 1 capsule (72 mcg total) by  mouth daily before breakfast. You can cut back to every other day if need be. (Patient taking differently: Take 72 mcg by mouth as needed. You can cut back to every other day if need be.) 30 capsule 5  . lisinopril (PRINIVIL,ZESTRIL) 20 MG tablet Take 20 mg by mouth daily.     . Multiple Vitamin (MULTIVITAMIN WITH MINERALS) TABS tablet Take 1 tablet by mouth daily.    . Multiple Vitamins-Minerals (PRESERVISION AREDS 2 PO) Take 1 capsule by mouth 2 (two) times daily.    . Omega-3 Fatty Acids (FISH OIL) 1200 MG CAPS Take 1,200 mg by mouth daily.    Marland Kitchen. omeprazole (PRILOSEC) 20 MG capsule Take 20 mg by mouth 2 (two) times daily before a meal.     . Probiotic Product (RA PROBIOTIC GUMMIES) CHEW Chew by mouth.    . saxagliptin HCl (ONGLYZA) 5 MG TABS tablet Take 5 mg by mouth daily.    . simvastatin (ZOCOR) 40 MG tablet Take 40 mg by mouth at bedtime.     . Zinc 50 MG TABS Take by mouth. Twice  per week     No current facility-administered medications on file prior to visit.     Allergies  Allergen Reactions  . Codeine Nausea Only    Social History   Substance and Sexual Activity  Alcohol Use No    Social History   Tobacco Use  Smoking Status Never Smoker  Smokeless Tobacco Never Used    Review of Systems  Constitutional: Negative.   HENT: Negative.   Eyes: Negative.   Respiratory: Negative.   Cardiovascular: Negative.   Gastrointestinal: Positive for blood in stool.  Genitourinary: Negative.   Musculoskeletal: Negative.   Skin: Negative.   Neurological: Negative.   Endo/Heme/Allergies: Negative.   Psychiatric/Behavioral: Negative.     Objective   Vitals:   04/13/17 1045  BP: (!) 159/70  Pulse: 67  Temp: 97.8 F (36.6 C)    Physical Exam  Constitutional: He is oriented to person, place, and time and well-developed, well-nourished, and in no distress.  HENT:  Head: Normocephalic and atraumatic.  Cardiovascular: Normal heart sounds. Exam reveals no gallop and no friction rub.  No murmur heard. .Irregularly irregular rhythm  Pulmonary/Chest: Effort normal and breath sounds normal. No respiratory distress. He has no rales.  Slight expiratory wheezing noted.  Genitourinary:  Genitourinary Comments: Small prominent external hemorrhoid along the left side with small internal hemorrhoids present.  No blood per rectum noted.  Normal sphincter tone.  Neurological: He is alert and oriented to person, place, and time.  Skin: Skin is warm and dry.  Vitals reviewed.   Assessment  Bleeding internal hemorrhoids, chronic anticoagulation, chronic atrial fibrillation, coronary artery disease, status post stent placement in April 2018 Plan   Patient needs hemorrhoidectomy once cleared by cardiology.  He will contact him and get clearance.  I do need to know whether this is a drug-eluting stent as he will have to be off his Plavix for 1 week and his Xarelto for  48 hours.  The risks and benefits of the procedure including bleeding, infection, cardiopulmonary difficulties, and recurrence of the hemorrhoidal disease were fully explained to the patient, who gave informed consent.

## 2017-05-30 ENCOUNTER — Ambulatory Visit (INDEPENDENT_AMBULATORY_CARE_PROVIDER_SITE_OTHER): Payer: Medicare Other | Admitting: General Surgery

## 2017-05-30 ENCOUNTER — Encounter: Payer: Self-pay | Admitting: General Surgery

## 2017-05-30 VITALS — BP 130/54 | HR 55 | Temp 98.4°F | Ht 65.0 in | Wt 185.0 lb

## 2017-05-30 DIAGNOSIS — K649 Unspecified hemorrhoids: Secondary | ICD-10-CM

## 2017-05-30 NOTE — Progress Notes (Signed)
Subjective:     Johnathan KannerWilliam R Chavez  Patient presents back with recurrent bleeding per rectum from hemorrhoidal disease.  He has seen his cardiologist who has taken him off his Eliquis.  He is only on Plavix at this time.  He would like to proceed with surgery. Objective:    BP (!) 130/54   Pulse (!) 55   Temp 98.4 F (36.9 C)   Ht 5\' 5"  (1.651 m)   Wt 185 lb (83.9 kg)   BMI 30.79 kg/m   General:  alert, cooperative and no distress       Assessment:    Bleeding hemorrhoids, now off Eliquis.    Plan:  Patient is scheduled for extensive hemorrhoidectomy on 06/07/2017.  The risks and benefits of the procedure including bleeding, infection, and recurrence of the hemorrhoidal disease were fully explained to the patient, who gave informed consent.  He is to stop his Plavix 5 days before the procedure.

## 2017-05-30 NOTE — H&P (Signed)
Johnathan Chavez; [id number]; 1940-05-09   HPI Patient is a 77 year old white male who was referred to my care by rocking him to gastroenterology for evaluation and treatment of bleeding hemorrhoids.  Patient has had bleeding hemorrhoids intermittently for the past few months.  It does turn the toilet bowl dark red.  He has a bowel movement every several days.  He does take a stool softener.  He recently had coronary stent placement in April 2018 and was on both Xarelto and Plavix.  This was done in [place].  A drug-eluting stent was placed.  He currently has 2 out of 10 pain.  He states the hemorrhoids prolapse out and he does push them back in.  He was seen by gastroenterology but they did not feel he was a candidate for banding. He recently saw his cardiologist and was taken off his Eliquis.  Past Medical History:  Diagnosis Date  . A-fib (HCC)   . CAD (coronary artery disease)   . CKD (chronic kidney disease) stage 2, GFR 60-89 ml/min   . GERD (gastroesophageal reflux disease)   . HTN (hypertension)   . Hyperlipidemia   . 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 APPENDECTOMY    . BACK SURGERY     X2  . CERVICAL DISC SURGERY    . COLONOSCOPY  04/2014   [facility]: moderate sized internal hemorrhoids, 2 tubular adenomas removed from colon. diverticulosis  . CORONARY ANGIOPLASTY WITH STENT PLACEMENT  08/18/2016   In a hospital in [place]  . CORONARY ARTERY BYPASS GRAFT  2001  . ESOPHAGOGASTRODUODENOSCOPY  01/2015   Dr. Allena Katz: gastritis  . INGUINAL HERNIA REPAIR Left   . SHOULDER ARTHROSCOPY Right   . SIGMOIDOSCOPY  01/2015   Dr. Allena Katz: internal hemorrhoids  . TONSILLECTOMY      Family History  Problem Relation Age of Onset  . Diabetes Mother   . Colon cancer Father        ?colon cancer, age early 22    Current Outpatient Medications on File Prior to Visit  Medication Sig Dispense Refill  .  amLODipine (NORVASC) 10 MG tablet Take 10 mg by mouth daily.  0  . apixaban (ELIQUIS) 5 MG TABS tablet Take 5 mg by mouth daily.     . Ascorbic Acid (VITAMIN C) 1000 MG tablet Take 1,000 mg by mouth daily.     . calcium-vitamin D (SM CALCIUM 500/VITAMIN D3) 500-400 MG-UNIT tablet Take 1 tablet by mouth daily.     . clopidogrel (PLAVIX) 75 MG tablet Take 75 mg by mouth daily.    . Cyanocobalamin (VITAMIN B 12 PO) Take 5,000 mg by mouth daily.     . ferrous gluconate (IRON 27) 240 (27 FE) MG tablet Take 240 mg by mouth 2 (two) times a week.    . gabapentin (NEURONTIN) 300 MG capsule Take 300 mg by mouth 2 (two) times daily.     . Ginkgo Biloba 60 MG TABS Take 60 mg by mouth daily.    Marland Kitchen glipiZIDE (GLUCOTROL) 10 MG tablet Take 10 mg by mouth 3 (three) times daily.    . Glucosamine-Chondroit-Vit C-Mn (GLUCOSAMINE 1500 COMPLEX PO) Take 1,500 mg by mouth daily.    . hydrocortisone (ANUSOL-HC) 2.5 % rectal cream Place 1 application rectally 2 (two) times daily. For up to 10 days at a time. 30 g 1  . linaclotide (LINZESS) 72 MCG capsule  Take 1 capsule (72 mcg total) by mouth daily before breakfast. You can cut back to every other day if need be. (Patient taking differently: Take 72 mcg by mouth as needed. You can cut back to every other day if need be.) 30 capsule 5  . lisinopril (PRINIVIL,ZESTRIL) 20 MG tablet Take 20 mg by mouth daily.     . Multiple Vitamin (MULTIVITAMIN WITH MINERALS) TABS tablet Take 1 tablet by mouth daily.    . Multiple Vitamins-Minerals (PRESERVISION AREDS 2 PO) Take 1 capsule by mouth 2 (two) times daily.    . Omega-3 Fatty Acids (FISH OIL) 1200 MG CAPS Take 1,200 mg by mouth daily.    Marland Kitchen. omeprazole (PRILOSEC) 20 MG capsule Take 20 mg by mouth 2 (two) times daily before a meal.     . Probiotic Product (RA PROBIOTIC GUMMIES) CHEW Chew by mouth.    . saxagliptin HCl (ONGLYZA) 5 MG TABS tablet Take 5 mg by mouth daily.    . simvastatin (ZOCOR) 40 MG tablet Take 40 mg by mouth at  bedtime.     . Zinc 50 MG TABS Take by mouth. Twice per week     No current facility-administered medications on file prior to visit.     Allergies  Allergen Reactions  . Codeine Nausea Only    Social History   Substance and Sexual Activity  Alcohol Use No    Social History   Tobacco Use  Smoking Status Never Smoker  Smokeless Tobacco Never Used    Review of Systems  Constitutional: Negative.   HENT: Negative.   Eyes: Negative.   Respiratory: Negative.   Cardiovascular: Negative.   Gastrointestinal: Positive for blood in stool.  Genitourinary: Negative.   Musculoskeletal: Negative.   Skin: Negative.   Neurological: Negative.   Endo/Heme/Allergies: Negative.   Psychiatric/Behavioral: Negative.     Objective   Vitals:   04/13/17 1045  BP: (!) 159/70  Pulse: 67  Temp: 97.8 F (36.6 C)    Physical Exam  Constitutional: He is oriented to person, place, and time and well-developed, well-nourished, and in no distress.  HENT:  Head: Normocephalic and atraumatic.  Cardiovascular: Normal heart sounds. Exam reveals no gallop and no friction rub.  No murmur heard. .Irregularly irregular rhythm  Pulmonary/Chest: Effort normal and breath sounds normal. No respiratory distress. He has no rales.  Slight expiratory wheezing noted.  Genitourinary:  Genitourinary Comments: Small prominent external hemorrhoid along the left side with small internal hemorrhoids present.  No blood per rectum noted.  Normal sphincter tone.  Neurological: He is alert and oriented to person, place, and time.  Skin: Skin is warm and dry.  Vitals reviewed.   Assessment  Bleeding internal hemorrhoids, chronic anticoagulation, chronic atrial fibrillation, coronary artery disease, status post stent placement in April 2018 Plan   Patient is scheduled for extensive hemorrhoidectomy on 06/07/2017.  The risks and benefits of the procedure including bleeding, infection, and recurrence of the  hemorrhoidal disease were fully explained to the patient, who gave informed consent.  He is to stop his Plavix 5 days before the procedure.

## 2017-05-31 NOTE — Patient Instructions (Signed)
Johnathan Chavez  05/31/2017     @PREFPERIOPPHARMACY @   Your procedure is scheduled on  06/07/2017 .  Report to [facility] at  645   A.M.  Call this number if you have problems the morning of surgery:  [phone]   Remember:  Do not eat food or drink liquids after midnight.  Take these medicines the morning of surgery with A SIP OF WATER  Norvasc, lexapro, neurontin, lisinopril, prilosec.   Do not wear jewelry, make-up or nail polish.  Do not wear lotions, powders, or perfumes, or deodorant.  Do not shave 48 hours prior to surgery.  Men may shave face and neck.  Do not bring valuables to the hospital.  [facility] is not responsible for any belongings or valuables.  Contacts, dentures or bridgework may not be worn into surgery.  Leave your suitcase in the car.  After surgery it may be brought to your room.  For patients admitted to the hospital, discharge time will be determined by your treatment team.  Patients discharged the day of surgery will not be allowed to drive home.   Name and phone number of your driver:   Family Special instructions:  None  Please read over the following fact sheets that you were given. Anesthesia Post-op Instructions and Care and Recovery After Surgery       Hemorrhoids Hemorrhoids are swollen veins in and around the rectum or anus. Hemorrhoids can cause pain, itching, or bleeding. Most of the time, they do not cause serious problems. They usually get better with diet changes, lifestyle changes, and other home treatments. Follow these instructions at home: Eating and drinking  Eat foods that have fiber, such as whole grains, beans, nuts, fruits, and vegetables. Ask your doctor about taking products that have added fiber (fibersupplements).  Drink enough fluid to keep your pee (urine) clear or pale yellow. For Pain and Swelling  Take a warm-water bath (sitz bath) for 20 minutes to ease pain. Do this 3-4 times a  day.  If directed, put ice on the painful area. It may be helpful to use ice between your warm baths. ? Put ice in a plastic bag. ? Place a towel between your skin and the bag. ? Leave the ice on for 20 minutes, 2-3 times a day. General instructions  Take over-the-counter and prescription medicines only as told by your doctor. ? Medicated creams and medicines that are inserted into the anus (suppositories) may be used or applied as told.  Exercise often.  Go to the bathroom when you have the urge to poop (to have a bowel movement). Do not wait.  Avoid pushing too hard (straining) when you poop.  Keep the butt area dry and clean. Use wet toilet paper or moist paper towels.  Do not sit on the toilet for a long time. Contact a doctor if:  You have any of these: ? Pain and swelling that do not get better with treatment or medicine. ? Bleeding that will not stop. ? Trouble pooping or you cannot poop. ? Pain or swelling outside the area of the hemorrhoids. This information is not intended to replace advice given to you by your health care provider. Make sure you discuss any questions you have with your health care provider. Document Released: 01/19/2008 Document Revised: 09/17/2015 Document Reviewed: 12/24/2014 Elsevier Interactive Patient Education  2018 [employer]. Surgical Procedures for Hemorrhoids, Care After Refer to this sheet in  the next few weeks. These instructions provide you with information about caring for yourself after your procedure. Your health care provider may also give you more specific instructions. Your treatment has been planned according to current medical practices, but problems sometimes occur. Call your health care provider if you have any problems or questions after your procedure. What can I expect after the procedure? After the procedure, it is common to have:  Rectal pain.  Pain when you are having a bowel movement.  Slight rectal  bleeding.  Follow these instructions at home: Medicines  Take over-the-counter and prescription medicines only as told by your health care provider.  Do not drive or operate heavy machinery while taking prescription pain medicine.  Use a stool softener or a bulk laxative as told by your health care provider. Activity  Rest at home. Return to your normal activities as told by your health care provider.  Do not lift anything that is heavier than 10 lb (4.5 kg).  Do not sit for long periods of time. Take a walk every day or as told by your health care provider.  Do not strain to have a bowel movement. Do not spend a long time sitting on the toilet. Eating and drinking  Eat foods that contain fiber, such as whole grains, beans, nuts, fruits, and vegetables.  Drink enough fluid to keep your urine clear or pale yellow. General instructions  Sit in a warm bath 2-3 times per day to relieve soreness or itching.  Keep all follow-up visits as told by your health care provider. This is important. Contact a health care provider if:  Your pain medicine is not helping.  You have a fever or chills.  You become constipated.  You have trouble passing urine. Get help right away if:  You have very bad rectal pain.  You have heavy bleeding from your rectum. This information is not intended to replace advice given to you by your health care provider. Make sure you discuss any questions you have with your health care provider. Document Released: 07/02/2003 Document Revised: 09/17/2015 Document Reviewed: 07/07/2014 Elsevier Interactive Patient Education  2018 [employer]  General Anesthesia, Adult General anesthesia is the use of medicines to make a person "go to sleep" (be unconscious) for a medical procedure. General anesthesia is often recommended when a procedure:  Is long.  Requires you to be still or in an unusual position.  Is major and can cause you to lose blood.  Is  impossible to do without general anesthesia.  The medicines used for general anesthesia are called general anesthetics. In addition to making you sleep, the medicines:  Prevent pain.  Control your blood pressure.  Relax your muscles.  Tell a health care provider about:  Any allergies you have.  All medicines you are taking, including vitamins, herbs, eye drops, creams, and over-the-counter medicines.  Any problems you or family members have had with anesthetic medicines.  Types of anesthetics you have had in the past.  Any bleeding disorders you have.  Any surgeries you have had.  Any medical conditions you have.  Any history of heart or lung conditions, such as heart failure, sleep apnea, or chronic obstructive pulmonary disease (COPD).  Whether you are pregnant or may be pregnant.  Whether you use tobacco, alcohol, marijuana, or street drugs.  Any history of Financial plannermilitary service.  Any history of depression or anxiety. What are the risks? Generally, this is a safe procedure. However, problems may occur, including:  Allergic  reaction to anesthetics.  Lung and heart problems.  Inhaling food or liquids from your stomach into your lungs (aspiration).  Injury to nerves.  Waking up during your procedure and being unable to move (rare).  Extreme agitation or a state of mental confusion (delirium) when you wake up from the anesthetic.  Air in the bloodstream, which can lead to stroke.  These problems are more likely to develop if you are having a major surgery or if you have an advanced medical condition. You can prevent some of these complications by answering all of your health care provider's questions thoroughly and by following all pre-procedure instructions. General anesthesia can cause side effects, including:  Nausea or vomiting  A sore throat from the breathing tube.  Feeling cold or shivery.  Feeling tired, washed out, or achy.  Sleepiness or  drowsiness.  Confusion or agitation.  What happens before the procedure? Staying hydrated Follow instructions from your health care provider about hydration, which may include:  Up to 2 hours before the procedure - you may continue to drink clear liquids, such as water, clear fruit juice, black coffee, and plain tea.  Eating and drinking restrictions Follow instructions from your health care provider about eating and drinking, which may include:  8 hours before the procedure - stop eating heavy meals or foods such as meat, fried foods, or fatty foods.  6 hours before the procedure - stop eating light meals or foods, such as toast or cereal.  6 hours before the procedure - stop drinking milk or drinks that contain milk.  2 hours before the procedure - stop drinking clear liquids.  Medicines  Ask your health care provider about: ? Changing or stopping your regular medicines. This is especially important if you are taking diabetes medicines or blood thinners. ? Taking medicines such as aspirin and ibuprofen. These medicines can thin your blood. Do not take these medicines before your procedure if your health care provider instructs you not to. ? Taking new dietary supplements or medicines. Do not take these during the week before your procedure unless your health care provider approves them.  If you are told to take a medicine or to continue taking a medicine on the day of the procedure, take the medicine with sips of water. General instructions   Ask if you will be going home the same day, the following day, or after a longer hospital stay. ? Plan to have someone take you home. ? Plan to have someone stay with you for the first 24 hours after you leave the hospital or clinic.  For 3-6 weeks before the procedure, try not to use any tobacco products, such as cigarettes, chewing tobacco, and e-cigarettes.  You may brush your teeth on the morning of the procedure, but make sure to  spit out the toothpaste. What happens during the procedure?  You will be given anesthetics through a mask and through an IV tube in one of your veins.  You may receive medicine to help you relax (sedative).  As soon as you are asleep, a breathing tube may be used to help you breathe.  An anesthesia specialist will stay with you throughout the procedure. He or she will help keep you comfortable and safe by continuing to give you medicines and adjusting the amount of medicine that you get. He or she will also watch your blood pressure, pulse, and oxygen levels to make sure that the anesthetics do not cause any problems.  If a breathing  tube was used to help you breathe, it will be removed before you wake up. The procedure may vary among health care providers and hospitals. What happens after the procedure?  You will wake up, often slowly, after the procedure is complete, usually in a recovery area.  Your blood pressure, heart rate, breathing rate, and blood oxygen level will be monitored until the medicines you were given have worn off.  You may be given medicine to help you calm down if you feel anxious or agitated.  If you will be going home the same day, your health care provider may check to make sure you can stand, drink, and urinate.  Your health care providers will treat your pain and side effects before you go home.  Do not drive for 24 hours if you received a sedative.  You may: ? Feel nauseous and vomit. ? Have a sore throat. ? Have mental slowness. ? Feel cold or shivery. ? Feel sleepy. ? Feel tired. ? Feel sore or achy, even in parts of your body where you did not have surgery. This information is not intended to replace advice given to you by your health care provider. Make sure you discuss any questions you have with your health care provider. Document Released: 07/19/2007 Document Revised: 09/22/2015 Document Reviewed: 03/26/2015 Elsevier Interactive Patient  Education  2018 [employer]. General Anesthesia, Adult, Care After These instructions provide you with information about caring for yourself after your procedure. Your health care provider may also give you more specific instructions. Your treatment has been planned according to current medical practices, but problems sometimes occur. Call your health care provider if you have any problems or questions after your procedure. What can I expect after the procedure? After the procedure, it is common to have:  Vomiting.  A sore throat.  Mental slowness.  It is common to feel:  Nauseous.  Cold or shivery.  Sleepy.  Tired.  Sore or achy, even in parts of your body where you did not have surgery.  Follow these instructions at home: For at least 24 hours after the procedure:  Do not: ? Participate in activities where you could fall or become injured. ? Drive. ? Use heavy machinery. ? Drink alcohol. ? Take sleeping pills or medicines that cause drowsiness. ? Make important decisions or sign legal documents. ? Take care of children on your own.  Rest. Eating and drinking  If you vomit, drink water, juice, or soup when you can drink without vomiting.  Drink enough fluid to keep your urine clear or pale yellow.  Make sure you have little or no nausea before eating solid foods.  Follow the diet recommended by your health care provider. General instructions  Have a responsible adult stay with you until you are awake and alert.  Return to your normal activities as told by your health care provider. Ask your health care provider what activities are safe for you.  Take over-the-counter and prescription medicines only as told by your health care provider.  If you smoke, do not smoke without supervision.  Keep all follow-up visits as told by your health care provider. This is important. Contact a health care provider if:  You continue to have nausea or vomiting at home, and  medicines are not helpful.  You cannot drink fluids or start eating again.  You cannot urinate after 8-12 hours.  You develop a skin rash.  You have fever.  You have increasing redness at the site of your  procedure. Get help right away if:  You have difficulty breathing.  You have chest pain.  You have unexpected bleeding.  You feel that you are having a life-threatening or urgent problem. This information is not intended to replace advice given to you by your health care provider. Make sure you discuss any questions you have with your health care provider. Document Released: 07/18/2000 Document Revised: 09/14/2015 Document Reviewed: 03/26/2015 Elsevier Interactive Patient Education  [employer].

## 2017-06-02 ENCOUNTER — Inpatient Hospital Stay (HOSPITAL_COMMUNITY): Admission: RE | Admit: 2017-06-02 | Payer: Medicare Other | Source: Ambulatory Visit

## 2017-06-05 ENCOUNTER — Encounter (HOSPITAL_COMMUNITY)
Admission: RE | Admit: 2017-06-05 | Discharge: 2017-06-05 | Disposition: A | Discharge status: Home / Self Care | Payer: Medicare Other | Source: Ambulatory Visit | Attending: General Surgery | Admitting: General Surgery

## 2017-06-05 ENCOUNTER — Encounter (HOSPITAL_COMMUNITY): Payer: Self-pay

## 2017-06-05 ENCOUNTER — Other Ambulatory Visit: Payer: Self-pay

## 2017-06-05 DIAGNOSIS — I129 Hypertensive chronic kidney disease with stage 1 through stage 4 chronic kidney disease, or unspecified chronic kidney disease: Secondary | ICD-10-CM | POA: Diagnosis not present

## 2017-06-05 DIAGNOSIS — K219 Gastro-esophageal reflux disease without esophagitis: Secondary | ICD-10-CM | POA: Diagnosis not present

## 2017-06-05 DIAGNOSIS — Z7984 Long term (current) use of oral hypoglycemic drugs: Secondary | ICD-10-CM | POA: Diagnosis not present

## 2017-06-05 DIAGNOSIS — E785 Hyperlipidemia, unspecified: Secondary | ICD-10-CM | POA: Diagnosis not present

## 2017-06-05 DIAGNOSIS — K649 Unspecified hemorrhoids: Secondary | ICD-10-CM | POA: Diagnosis present

## 2017-06-05 DIAGNOSIS — N182 Chronic kidney disease, stage 2 (mild): Secondary | ICD-10-CM | POA: Diagnosis not present

## 2017-06-05 DIAGNOSIS — K644 Residual hemorrhoidal skin tags: Secondary | ICD-10-CM | POA: Diagnosis not present

## 2017-06-05 DIAGNOSIS — Z79899 Other long term (current) drug therapy: Secondary | ICD-10-CM | POA: Diagnosis not present

## 2017-06-05 DIAGNOSIS — Z7901 Long term (current) use of anticoagulants: Secondary | ICD-10-CM | POA: Diagnosis not present

## 2017-06-05 DIAGNOSIS — Z951 Presence of aortocoronary bypass graft: Secondary | ICD-10-CM | POA: Diagnosis not present

## 2017-06-05 DIAGNOSIS — Z7902 Long term (current) use of antithrombotics/antiplatelets: Secondary | ICD-10-CM | POA: Diagnosis not present

## 2017-06-05 DIAGNOSIS — K921 Melena: Secondary | ICD-10-CM | POA: Diagnosis not present

## 2017-06-05 DIAGNOSIS — E1122 Type 2 diabetes mellitus with diabetic chronic kidney disease: Secondary | ICD-10-CM | POA: Diagnosis not present

## 2017-06-05 DIAGNOSIS — K648 Other hemorrhoids: Secondary | ICD-10-CM | POA: Diagnosis not present

## 2017-06-05 DIAGNOSIS — I2510 Atherosclerotic heart disease of native coronary artery without angina pectoris: Secondary | ICD-10-CM | POA: Diagnosis not present

## 2017-06-05 DIAGNOSIS — Z955 Presence of coronary angioplasty implant and graft: Secondary | ICD-10-CM | POA: Diagnosis not present

## 2017-06-05 DIAGNOSIS — I482 Chronic atrial fibrillation: Secondary | ICD-10-CM | POA: Diagnosis not present

## 2017-06-05 HISTORY — DX: Cardiac arrhythmia, unspecified: I49.9

## 2017-06-05 HISTORY — DX: Major depressive disorder, single episode, unspecified: F32.9

## 2017-06-05 HISTORY — DX: Unspecified osteoarthritis, unspecified site: M19.90

## 2017-06-05 HISTORY — DX: Anxiety disorder, unspecified: F41.9

## 2017-06-05 HISTORY — DX: Depression, unspecified: F32.A

## 2017-06-05 LAB — CBC WITH DIFFERENTIAL/PLATELET
BASOS ABS: 0 10*3/uL (ref 0.0–0.1)
BASOS PCT: 0 %
EOS ABS: 0.4 10*3/uL (ref 0.0–0.7)
EOS PCT: 3 %
HCT: 37.1 % — ABNORMAL LOW (ref 39.0–52.0)
Hemoglobin: 12.3 g/dL — ABNORMAL LOW (ref 13.0–17.0)
Lymphocytes Relative: 21 %
Lymphs Abs: 2.2 10*3/uL (ref 0.7–4.0)
MCH: 29.6 pg (ref 26.0–34.0)
MCHC: 33.2 g/dL (ref 30.0–36.0)
MCV: 89.2 fL (ref 78.0–100.0)
MONO ABS: 1 10*3/uL (ref 0.1–1.0)
Monocytes Relative: 10 %
Neutro Abs: 6.8 10*3/uL (ref 1.7–7.7)
Neutrophils Relative %: 66 %
PLATELETS: 217 10*3/uL (ref 150–400)
RBC: 4.16 MIL/uL — ABNORMAL LOW (ref 4.22–5.81)
RDW: 14.4 % (ref 11.5–15.5)
WBC: 10.4 10*3/uL (ref 4.0–10.5)

## 2017-06-05 LAB — BASIC METABOLIC PANEL
Anion gap: 13 (ref 5–15)
BUN: 26 mg/dL — AB (ref 6–20)
CO2: 20 mmol/L — ABNORMAL LOW (ref 22–32)
CREATININE: 1.5 mg/dL — AB (ref 0.61–1.24)
Calcium: 8.9 mg/dL (ref 8.9–10.3)
Chloride: 101 mmol/L (ref 101–111)
GFR calc Af Amer: 50 mL/min — ABNORMAL LOW (ref >60)
GFR, EST NON AFRICAN AMERICAN: 43 mL/min — AB (ref >60)
GLUCOSE: 232 mg/dL — AB (ref 65–99)
Potassium: 3.9 mmol/L (ref 3.5–5.1)
Sodium: 134 mmol/L — ABNORMAL LOW (ref 135–145)

## 2017-06-05 LAB — HEMOGLOBIN A1C
HEMOGLOBIN A1C: 9.5 % — AB (ref 4.8–5.6)
MEAN PLASMA GLUCOSE: 225.95 mg/dL

## 2017-06-05 LAB — GLUCOSE, CAPILLARY: GLUCOSE-CAPILLARY: 249 mg/dL — AB (ref 65–99)

## 2017-06-05 NOTE — Pre-Procedure Instructions (Signed)
HgbA1C routed to PCP. 

## 2017-06-07 ENCOUNTER — Ambulatory Visit (HOSPITAL_COMMUNITY): Payer: Medicare Other | Admitting: Anesthesiology

## 2017-06-07 ENCOUNTER — Ambulatory Visit (HOSPITAL_COMMUNITY)
Admission: RE | Admit: 2017-06-07 | Discharge: 2017-06-07 | Disposition: A | Discharge status: Home / Self Care | Payer: Medicare Other | Source: Ambulatory Visit | Attending: General Surgery | Admitting: General Surgery

## 2017-06-07 ENCOUNTER — Encounter (HOSPITAL_COMMUNITY)
Admission: RE | Disposition: A | Discharge status: Home / Self Care | Payer: Self-pay | Source: Ambulatory Visit | Attending: General Surgery

## 2017-06-07 DIAGNOSIS — I2510 Atherosclerotic heart disease of native coronary artery without angina pectoris: Secondary | ICD-10-CM | POA: Insufficient documentation

## 2017-06-07 DIAGNOSIS — Z7902 Long term (current) use of antithrombotics/antiplatelets: Secondary | ICD-10-CM | POA: Insufficient documentation

## 2017-06-07 DIAGNOSIS — E785 Hyperlipidemia, unspecified: Secondary | ICD-10-CM | POA: Insufficient documentation

## 2017-06-07 DIAGNOSIS — K219 Gastro-esophageal reflux disease without esophagitis: Secondary | ICD-10-CM | POA: Insufficient documentation

## 2017-06-07 DIAGNOSIS — N182 Chronic kidney disease, stage 2 (mild): Secondary | ICD-10-CM | POA: Insufficient documentation

## 2017-06-07 DIAGNOSIS — Z79899 Other long term (current) drug therapy: Secondary | ICD-10-CM | POA: Insufficient documentation

## 2017-06-07 DIAGNOSIS — Z7984 Long term (current) use of oral hypoglycemic drugs: Secondary | ICD-10-CM | POA: Insufficient documentation

## 2017-06-07 DIAGNOSIS — K648 Other hemorrhoids: Secondary | ICD-10-CM | POA: Insufficient documentation

## 2017-06-07 DIAGNOSIS — K921 Melena: Secondary | ICD-10-CM | POA: Diagnosis not present

## 2017-06-07 DIAGNOSIS — I482 Chronic atrial fibrillation: Secondary | ICD-10-CM | POA: Insufficient documentation

## 2017-06-07 DIAGNOSIS — I129 Hypertensive chronic kidney disease with stage 1 through stage 4 chronic kidney disease, or unspecified chronic kidney disease: Secondary | ICD-10-CM | POA: Insufficient documentation

## 2017-06-07 DIAGNOSIS — Z951 Presence of aortocoronary bypass graft: Secondary | ICD-10-CM | POA: Insufficient documentation

## 2017-06-07 DIAGNOSIS — Z955 Presence of coronary angioplasty implant and graft: Secondary | ICD-10-CM | POA: Insufficient documentation

## 2017-06-07 DIAGNOSIS — K644 Residual hemorrhoidal skin tags: Secondary | ICD-10-CM

## 2017-06-07 DIAGNOSIS — Z7901 Long term (current) use of anticoagulants: Secondary | ICD-10-CM | POA: Insufficient documentation

## 2017-06-07 DIAGNOSIS — E1122 Type 2 diabetes mellitus with diabetic chronic kidney disease: Secondary | ICD-10-CM | POA: Insufficient documentation

## 2017-06-07 HISTORY — PX: HEMORRHOID SURGERY: SHX153

## 2017-06-07 LAB — GLUCOSE, CAPILLARY: Glucose-Capillary: 193 mg/dL — ABNORMAL HIGH (ref 65–99)

## 2017-06-07 SURGERY — HEMORRHOIDECTOMY
Anesthesia: General | Site: Rectum

## 2017-06-07 MED ORDER — SUCCINYLCHOLINE CHLORIDE 20 MG/ML IJ SOLN
INTRAMUSCULAR | Status: DC | PRN
  Administered 2017-06-07: 150 mg via INTRAVENOUS

## 2017-06-07 MED ORDER — CHLORHEXIDINE GLUCONATE CLOTH 2 % EX PADS: 6.0000 | MEDICATED_PAD | Freq: Once | CUTANEOUS | Status: DC

## 2017-06-07 MED ORDER — KETOROLAC TROMETHAMINE 30 MG/ML IJ SOLN
INTRAMUSCULAR | Status: AC
  Filled 2017-06-07: qty 1

## 2017-06-07 MED ORDER — ETOMIDATE 2 MG/ML IV SOLN
INTRAVENOUS | Status: AC
  Filled 2017-06-07: qty 10

## 2017-06-07 MED ORDER — HEMOSTATIC AGENTS (NO CHARGE) OPTIME
TOPICAL | Status: DC | PRN
  Administered 2017-06-07: 1 via TOPICAL

## 2017-06-07 MED ORDER — FENTANYL CITRATE (PF) 100 MCG/2ML IJ SOLN: 25.0000 ug | INTRAMUSCULAR | Status: DC | PRN

## 2017-06-07 MED ORDER — SODIUM CHLORIDE 0.9 % IV SOLN
1.0000 g | INTRAVENOUS | Status: AC
  Administered 2017-06-07: 1 g via INTRAVENOUS
  Filled 2017-06-07: qty 1

## 2017-06-07 MED ORDER — MIDAZOLAM HCL 2 MG/2ML IJ SOLN
INTRAMUSCULAR | Status: AC
  Filled 2017-06-07: qty 2

## 2017-06-07 MED ORDER — SUGAMMADEX SODIUM 200 MG/2ML IV SOLN
INTRAVENOUS | Status: AC
  Filled 2017-06-07: qty 2

## 2017-06-07 MED ORDER — FENTANYL CITRATE (PF) 100 MCG/2ML IJ SOLN
INTRAMUSCULAR | Status: DC | PRN
  Administered 2017-06-07: 50 ug via INTRAVENOUS

## 2017-06-07 MED ORDER — KETOROLAC TROMETHAMINE 30 MG/ML IJ SOLN
30.0000 mg | Freq: Once | INTRAMUSCULAR | Status: AC
  Administered 2017-06-07: 30 mg via INTRAVENOUS

## 2017-06-07 MED ORDER — ONDANSETRON HCL 4 MG/2ML IJ SOLN
INTRAMUSCULAR | Status: AC
  Filled 2017-06-07: qty 2

## 2017-06-07 MED ORDER — ONDANSETRON HCL 4 MG/2ML IJ SOLN
4.0000 mg | Freq: Once | INTRAMUSCULAR | Status: AC
  Administered 2017-06-07: 4 mg via INTRAVENOUS

## 2017-06-07 MED ORDER — BUPIVACAINE LIPOSOME 1.3 % IJ SUSP
INTRAMUSCULAR | Status: AC
  Filled 2017-06-07: qty 20

## 2017-06-07 MED ORDER — ETOMIDATE 2 MG/ML IV SOLN
INTRAVENOUS | Status: DC | PRN
  Administered 2017-06-07: 12 mg via INTRAVENOUS

## 2017-06-07 MED ORDER — SODIUM CHLORIDE 0.9 % IR SOLN
Status: DC | PRN
  Administered 2017-06-07: 1000 mL

## 2017-06-07 MED ORDER — LIDOCAINE HCL 1 % IJ SOLN
INTRAMUSCULAR | Status: DC | PRN
  Administered 2017-06-07: 25 mg via INTRADERMAL

## 2017-06-07 MED ORDER — ROCURONIUM BROMIDE 100 MG/10ML IV SOLN
INTRAVENOUS | Status: DC | PRN
  Administered 2017-06-07: 10 mg via INTRAVENOUS

## 2017-06-07 MED ORDER — LIDOCAINE HCL (PF) 1 % IJ SOLN
INTRAMUSCULAR | Status: AC
  Filled 2017-06-07: qty 5

## 2017-06-07 MED ORDER — MIDAZOLAM HCL 2 MG/2ML IJ SOLN
1.0000 mg | INTRAMUSCULAR | Status: AC
  Administered 2017-06-07: 2 mg via INTRAVENOUS

## 2017-06-07 MED ORDER — BUPIVACAINE LIPOSOME 1.3 % IJ SUSP
INTRAMUSCULAR | Status: DC | PRN
  Administered 2017-06-07: 20 mL

## 2017-06-07 MED ORDER — LACTATED RINGERS IV SOLN
INTRAVENOUS | Status: DC
  Administered 2017-06-07: 08:00:00 via INTRAVENOUS

## 2017-06-07 MED ORDER — LIDOCAINE VISCOUS 2 % MT SOLN
OROMUCOSAL | Status: AC
  Filled 2017-06-07: qty 15

## 2017-06-07 MED ORDER — PROPOFOL 10 MG/ML IV BOLUS
INTRAVENOUS | Status: AC
  Filled 2017-06-07: qty 40

## 2017-06-07 MED ORDER — SUGAMMADEX SODIUM 200 MG/2ML IV SOLN
INTRAVENOUS | Status: DC | PRN
  Administered 2017-06-07: 175 mg via INTRAVENOUS

## 2017-06-07 MED ORDER — ROCURONIUM BROMIDE 50 MG/5ML IV SOLN
INTRAVENOUS | Status: AC
  Filled 2017-06-07: qty 1

## 2017-06-07 MED ORDER — LIDOCAINE VISCOUS 2 % MT SOLN
OROMUCOSAL | Status: DC | PRN
  Administered 2017-06-07: 1 via OROMUCOSAL

## 2017-06-07 MED ORDER — FENTANYL CITRATE (PF) 100 MCG/2ML IJ SOLN
INTRAMUSCULAR | Status: AC
  Filled 2017-06-07: qty 4

## 2017-06-07 SURGICAL SUPPLY — 29 items
BAG HAMPER (MISCELLANEOUS) ×2 IMPLANT
CLOTH BEACON ORANGE TIMEOUT ST (SAFETY) ×2 IMPLANT
COVER LIGHT HANDLE STERIS (MISCELLANEOUS) ×4 IMPLANT
DECANTER SPIKE VIAL GLASS SM (MISCELLANEOUS) ×2 IMPLANT
DRAPE HALF SHEET 40X57 (DRAPES) ×2 IMPLANT
DRAPE PROXIMA HALF (DRAPES) ×2 IMPLANT
ELECT REM PT RETURN 9FT ADLT (ELECTROSURGICAL) ×2
ELECTRODE REM PT RTRN 9FT ADLT (ELECTROSURGICAL) ×1 IMPLANT
FORMALIN 10 PREFIL 120ML (MISCELLANEOUS) ×2 IMPLANT
GAUZE SPONGE 4X4 12PLY STRL (GAUZE/BANDAGES/DRESSINGS) ×2 IMPLANT
GLOVE BIOGEL PI IND STRL 7.0 (GLOVE) ×1 IMPLANT
GLOVE BIOGEL PI INDICATOR 7.0 (GLOVE) ×1
GLOVE SURG SS PI 7.5 STRL IVOR (GLOVE) ×4 IMPLANT
GOWN STRL REUS W/ TWL XL LVL3 (GOWN DISPOSABLE) ×1 IMPLANT
GOWN STRL REUS W/TWL LRG LVL3 (GOWN DISPOSABLE) ×2 IMPLANT
GOWN STRL REUS W/TWL XL LVL3 (GOWN DISPOSABLE) ×1
HEMOSTAT SURGICEL 4X8 (HEMOSTASIS) ×2 IMPLANT
KIT ROOM TURNOVER AP CYSTO (KITS) ×2 IMPLANT
LIGASURE IMPACT 36 18CM CVD LR (INSTRUMENTS) ×2 IMPLANT
MANIFOLD NEPTUNE II (INSTRUMENTS) ×2 IMPLANT
NEEDLE HYPO 22GX1.5 SAFETY (NEEDLE) ×2 IMPLANT
NS IRRIG 1000ML POUR BTL (IV SOLUTION) ×2 IMPLANT
PACK PERI GYN (CUSTOM PROCEDURE TRAY) ×2 IMPLANT
PAD ARMBOARD 7.5X6 YLW CONV (MISCELLANEOUS) ×2 IMPLANT
SET BASIN LINEN APH (SET/KITS/TRAYS/PACK) ×2 IMPLANT
SURGILUBE 3G PEEL PACK STRL (MISCELLANEOUS) ×2 IMPLANT
SUT SILK 0 FSL (SUTURE) ×2 IMPLANT
SUT VIC AB 2-0 CT2 27 (SUTURE) IMPLANT
SYR 20CC LL (SYRINGE) ×2 IMPLANT

## 2017-06-07 NOTE — Anesthesia Postprocedure Evaluation (Signed)
Anesthesia Post Note  Patient: Johnathan KannerWilliam R Bollig  Procedure(s) Performed: EXTENSIVE HEMORRHOIDECTOMY (N/A Rectum)  Patient location during evaluation: PACU Anesthesia Type: General Level of consciousness: awake and patient cooperative Pain management: pain level controlled Vital Signs Assessment: post-procedure vital signs reviewed and stable Respiratory status: spontaneous breathing, nonlabored ventilation and respiratory function stable Cardiovascular status: blood pressure returned to baseline Postop Assessment: no apparent nausea or vomiting Anesthetic complications: no     Last Vitals:  Vitals:   06/07/17 0955 06/07/17 1000  BP:  (!) 108/39  Pulse: (!) 51 97  Resp: 10 14  Temp:    SpO2: 93% 94%    Last Pain:  Vitals:   06/07/17 0936  TempSrc:   PainSc: 0-No pain                 Any Mcneice J

## 2017-06-07 NOTE — Progress Notes (Signed)
Rectal packing removed. No rectal drainage. Tolerated well.

## 2017-06-07 NOTE — Progress Notes (Signed)
O2 sat 88-96% on room air. resp adequate/nonlabored. BBS clear/equal on auscultation. Encouraged to deep breath. Occasional congested non-productive cough present. Dr Jayme CloudGonzalez at bedside to check pt. No new orders given.

## 2017-06-07 NOTE — Anesthesia Procedure Notes (Signed)
Procedure Name: Intubation Date/Time: 06/07/2017 8:42 AM Performed by: Charmaine Downs, CRNA Pre-anesthesia Checklist: Patient identified, Patient being monitored, Timeout performed, Emergency Drugs available and Suction available Patient Re-evaluated:Patient Re-evaluated prior to induction Oxygen Delivery Method: Circle System Utilized Preoxygenation: Pre-oxygenation with 100% oxygen Induction Type: IV induction, Rapid sequence and Cricoid Pressure applied Ventilation: Mask ventilation without difficulty Laryngoscope Size: Mac and 4 Grade View: Grade II Tube type: Oral Tube size: 8.0 mm Number of attempts: 1 Airway Equipment and Method: stylet Placement Confirmation: ETT inserted through vocal cords under direct vision,  positive ETCO2 and breath sounds checked- equal and bilateral Secured at: 22 cm Tube secured with: Tape Dental Injury: Teeth and Oropharynx as per pre-operative assessment

## 2017-06-07 NOTE — Progress Notes (Signed)
Instructed on incentive spirometer. 1500 ml obtained. Tolerated well. 

## 2017-06-07 NOTE — Anesthesia Preprocedure Evaluation (Signed)
Anesthesia Evaluation  Patient identified by MRN, date of birth, ID band Patient awake    Reviewed: Allergy & Precautions, NPO status , Patient's Chart, lab work & pertinent test results  Airway Mallampati: III  TM Distance: >3 FB Neck ROM: Full    Dental  (+) Edentulous Upper   Pulmonary neg pulmonary ROS,    breath sounds clear to auscultation       Cardiovascular hypertension, + CAD, + Cardiac Stents, + CABG and + DOE  + dysrhythmias Atrial Fibrillation  Rhythm:Regular Rate:Normal     Neuro/Psych PSYCHIATRIC DISORDERS Anxiety Depression negative neurological ROS     GI/Hepatic GERD  Medicated and Poorly Controlled,  Endo/Other  diabetes, Type 2  Renal/GU Renal InsufficiencyRenal disease     Musculoskeletal   Abdominal   Peds  Hematology   Anesthesia Other Findings   Reproductive/Obstetrics                             Anesthesia Physical Anesthesia Plan  ASA: III  Anesthesia Plan: General   Post-op Pain Management:    Induction: Intravenous, Rapid sequence and Cricoid pressure planned  PONV Risk Score and Plan:   Airway Management Planned: Oral ETT  Additional Equipment:   Intra-op Plan:   Post-operative Plan: Extubation in OR  Informed Consent: I have reviewed the patients History and Physical, chart, labs and discussed the procedure including the risks, benefits and alternatives for the proposed anesthesia with the patient or authorized representative who has indicated his/her understanding and acceptance.     Plan Discussed with:   Anesthesia Plan Comments:         Anesthesia Quick Evaluation

## 2017-06-07 NOTE — Discharge Instructions (Signed)
Surgical Procedures for Hemorrhoids, Care After °Refer to this sheet in the next few weeks. These instructions provide you with information about caring for yourself after your procedure. Your health care provider may also give you more specific instructions. Your treatment has been planned according to current medical practices, but problems sometimes occur. Call your health care provider if you have any problems or questions after your procedure. °What can I expect after the procedure? °After the procedure, it is common to have: °· Rectal pain. °· Pain when you are having a bowel movement. °· Slight rectal bleeding. ° °Follow these instructions at home: °Medicines °· Take over-the-counter and prescription medicines only as told by your health care provider. °· Do not drive or operate heavy machinery while taking prescription pain medicine. °· Use a stool softener or a bulk laxative as told by your health care provider. °Activity °· Rest at home. Return to your normal activities as told by your health care provider. °· Do not lift anything that is heavier than 10 lb (4.5 kg). °· Do not sit for long periods of time. Take a walk every day or as told by your health care provider. °· Do not strain to have a bowel movement. Do not spend a long time sitting on the toilet. °Eating and drinking °· Eat foods that contain fiber, such as whole grains, beans, nuts, fruits, and vegetables. °· Drink enough fluid to keep your urine clear or pale yellow. °General instructions °· Sit in a warm bath 2-3 times per day to relieve soreness or itching. °· Keep all follow-up visits as told by your health care provider. This is important. °Contact a health care provider if: °· Your pain medicine is not helping. °· You have a fever or chills. °· You become constipated. °· You have trouble passing urine. °Get help right away if: °· You have very bad rectal pain. °· You have heavy bleeding from your rectum. °This information is not intended  to replace advice given to you by your health care provider. Make sure you discuss any questions you have with your health care provider. °Document Released: 07/02/2003 Document Revised: 09/17/2015 Document Reviewed: 07/07/2014 °Elsevier Interactive Patient Education © 2018 [employer] Inc. ° ° ° °PATIENT INSTRUCTIONS °POST-ANESTHESIA ° °IMMEDIATELY FOLLOWING SURGERY:  Do not drive or operate machinery for the first twenty four hours after surgery.  Do not make any important decisions for twenty four hours after surgery or while taking narcotic pain medications or sedatives.  If you develop intractable nausea and vomiting or a severe headache please notify your doctor immediately. ° °FOLLOW-UP:  Please make an appointment with your surgeon as instructed. You do not need to follow up with anesthesia unless specifically instructed to do so. ° °WOUND CARE INSTRUCTIONS (if applicable):  Keep a dry clean dressing on the anesthesia/puncture wound site if there is drainage.  Once the wound has quit draining you may leave it open to air.  Generally you should leave the bandage intact for twenty four hours unless there is drainage.  If the epidural site drains for more than 36-48 hours please call the anesthesia department. ° °QUESTIONS?:  Please feel free to call your physician or the hospital operator if you have any questions, and they will be happy to assist you.    ° ° ° ° °

## 2017-06-07 NOTE — Transfer of Care (Signed)
Immediate Anesthesia Transfer of Care Note  Patient: Johnathan Chavez  Procedure(s) Performed: EXTENSIVE HEMORRHOIDECTOMY (N/A Rectum)  Patient Location: PACU  Anesthesia Type:General  Level of Consciousness: drowsy  Airway & Oxygen Therapy: Patient connected to face mask oxygen  Post-op Assessment: Report given to RN, Post -op Vital signs reviewed and stable and Patient moving all extremities  Post vital signs: Reviewed and stable  Last Vitals:  Vitals:   06/07/17 0800 06/07/17 0825  BP:  123/75  Pulse:    Resp: 16 (!) 21  Temp:    SpO2: 94% 95%    Last Pain:  Vitals:   06/07/17 0720  TempSrc: Oral  PainSc: 3       Patients Stated Pain Goal: 5 (06/07/17 0720)  Complications: No apparent anesthesia complications

## 2017-06-07 NOTE — Op Note (Signed)
Patient:  Johnathan Chavez  DOB:  Mar 18, 1941  MRN:  [id number]   Preop Diagnosis: Bleeding hemorrhoidal disease  Postop Diagnosis: Same  Procedure: Extensive hemorrhoidectomy  Surgeon: Franky MachoMark Sumaya Riedesel, MD  Anes: General  Indications: Patient is a 77 year old white male who presents with bleeding hemorrhoidal disease.  He was recently stopped his Eliquis and is only on Plavix now.  Patient presents for an extensive hemorrhoidectomy.  The risks and benefits of the procedure including bleeding, infection, and recurrence of the hemorrhoidal disease were fully explained to the patient, who gave informed consent.  Procedure note: The patient was placed in the lithotomy position after general anesthesia was administered.  The perineum was prepped and draped using the usual sterile technique with Betadine.  Surgical site confirmation was performed.  On rectal examination, the patient had 2 large columns of internal and external hemorrhoidal tissue with evidence of recent bleeding present at the 4:00 and 6:00 positions.  A smaller internal hemorrhoid was noted at the 8 o'clock position.  The 2 columns were excised in a Ferguson-like manner using the LigaSure.  The internal hemorrhoid was also excised using the LigaSure.  Care was taken to avoid the external sphincter mechanism.  No abnormal bleeding was noted at the end of the procedure.  Exparel was instilled into the surrounding perineum.  Surgicel and Viscous Xylocaine rectal packing was then placed.  All tape and needle counts were correct at the end of the procedure.  The patient was awakened and transferred to PACU in stable condition.  Complications: None  EBL: Minimal  Specimen: Hemorrhoids

## 2017-06-07 NOTE — Interval H&P Note (Signed)
History and Physical Interval Note:  06/07/2017 8:02 AM  Johnathan KannerWilliam R Cordova  has presented today for surgery, with the diagnosis of bleeding hemorrhoids  The various methods of treatment have been discussed with the patient and family. After consideration of risks, benefits and other options for treatment, the patient has consented to  Procedure(s): EXTENSIVE HEMORRHOIDECTOMY (N/A) as a surgical intervention .  The patient's history has been reviewed, patient examined, no change in status, stable for surgery.  I have reviewed the patient's chart and labs.  Questions were answered to the patient's satisfaction.     Franky MachoMark Venancio Chenier

## 2017-06-08 ENCOUNTER — Encounter (HOSPITAL_COMMUNITY): Payer: Self-pay | Admitting: General Surgery

## 2017-06-13 ENCOUNTER — Encounter: Payer: Self-pay | Admitting: General Surgery

## 2017-06-13 ENCOUNTER — Ambulatory Visit (INDEPENDENT_AMBULATORY_CARE_PROVIDER_SITE_OTHER): Payer: Self-pay | Admitting: General Surgery

## 2017-06-13 VITALS — BP 120/53 | HR 58 | Temp 98.4°F | Ht 65.0 in | Wt 181.0 lb

## 2017-06-13 DIAGNOSIS — Z09 Encounter for follow-up examination after completed treatment for conditions other than malignant neoplasm: Secondary | ICD-10-CM

## 2017-06-13 NOTE — Progress Notes (Signed)
Subjective:     Tawni PummelWilliam R Nickolas  Status post extensive hemorrhoidectomy.  Patient states that he has had minimal blood per rectum when he wipes himself.  He is having some soilage of his underwear, he does not have incontinence.  He has a pain level of 3 out of 10. Objective:    BP (!) 120/53   Pulse (!) 58   Temp 98.4 F (36.9 C)   Ht 5\' 5"  (1.651 m)   Wt 181 lb (82.1 kg)   BMI 30.12 kg/m   General:  alert, cooperative and no distress  Rectal examination reveals normal healing hemorrhoidectomy.  Mild swelling is present. Final pathology consistent with diagnosis.     Assessment:    Doing well from a surgical standpoint.    Plan:  I told the patient that he should not sit longer on the toilet when defecating.  He should increase the fiber in his diet to help with the firmness of the stools.  The mild soilage she is having should resolve with time.  Follow-up here in 3 weeks.  Reassured patient that he was recovering normally.

## 2017-07-04 ENCOUNTER — Ambulatory Visit: Payer: Medicare Other | Admitting: General Surgery

## 2017-09-02 IMAGING — US US ABDOMEN LIMITED
1 series · 14 of 25 positions shown · non-contrast
Comparison: 02/03/2016 CT of the abdomen and pelvis.

CLINICAL DATA: 74 y/o M; right upper quadrant pain for 1 day with
transaminitis.

EXAM:
US ABDOMEN LIMITED - RIGHT UPPER QUADRANT

[Series 1: us abdomen limited · 0.26mm/px · 14 of 95 slices shown]
[im 1/95]
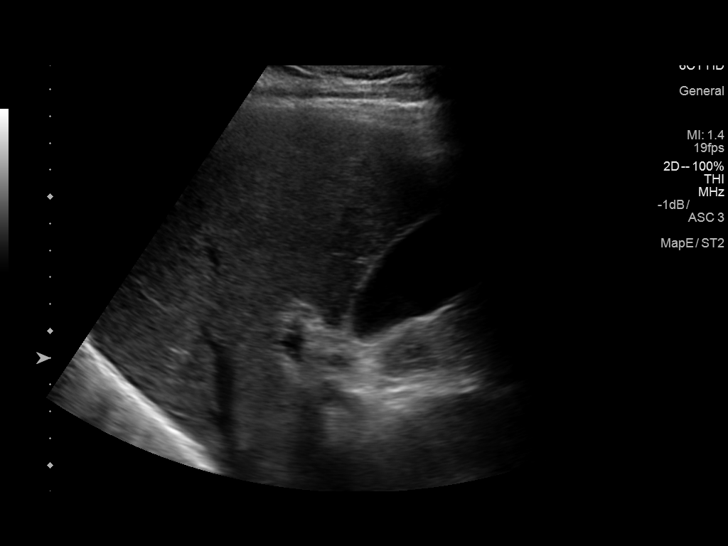
[im 8/95]
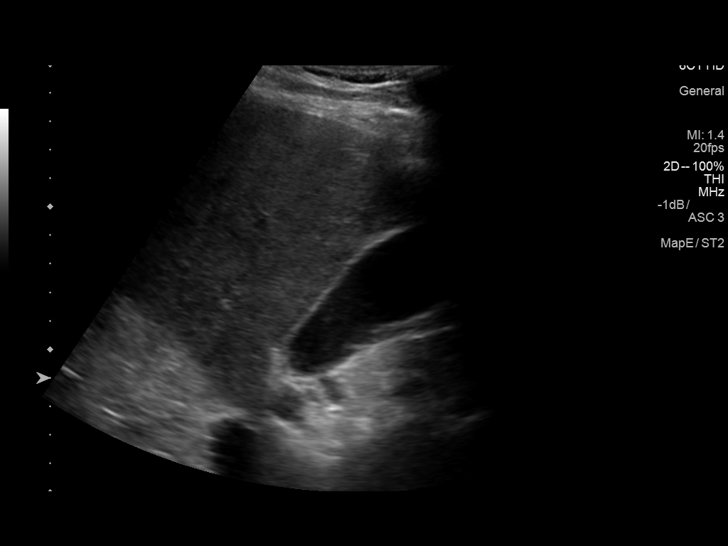
[im 16/95]
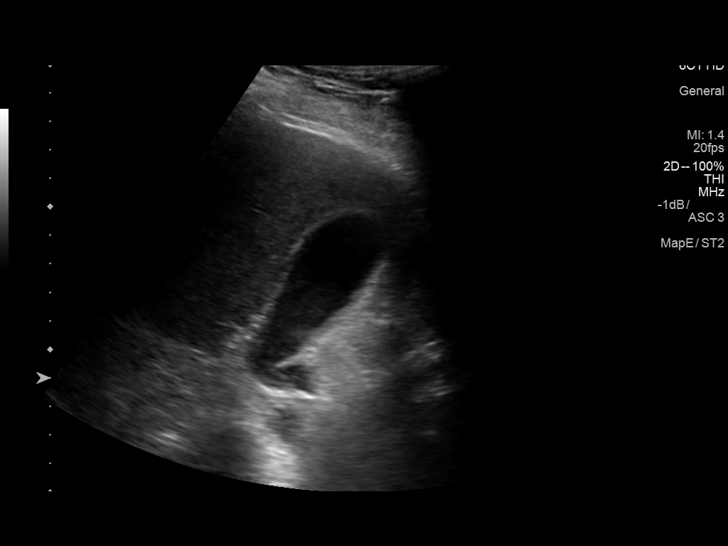
[im 24/95]
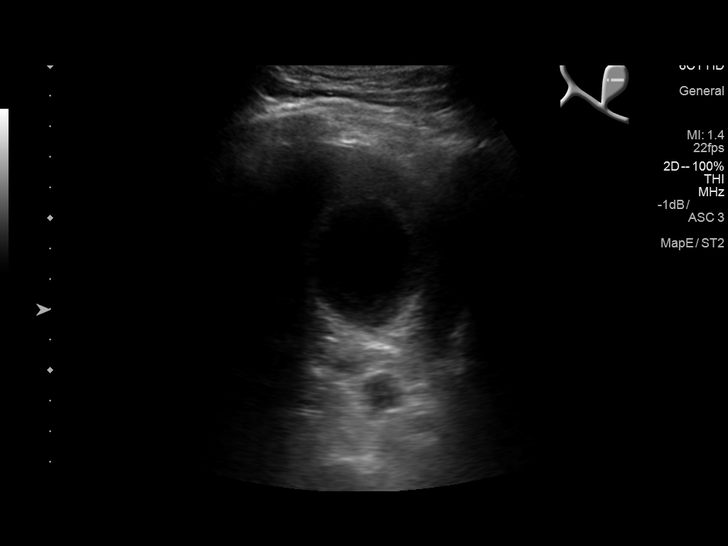
[im 32/95]
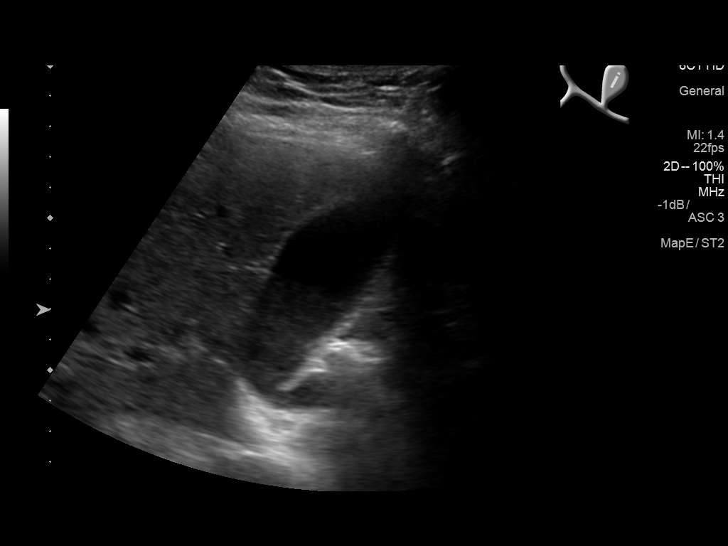
[im 36/95]
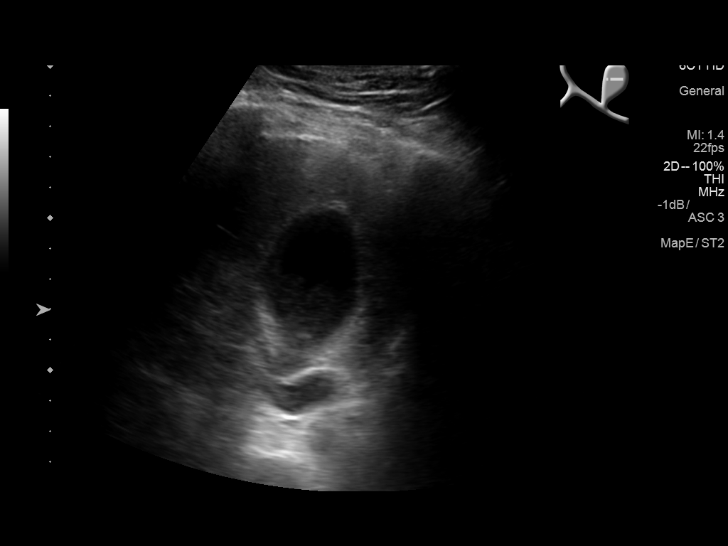
[im 44/95]
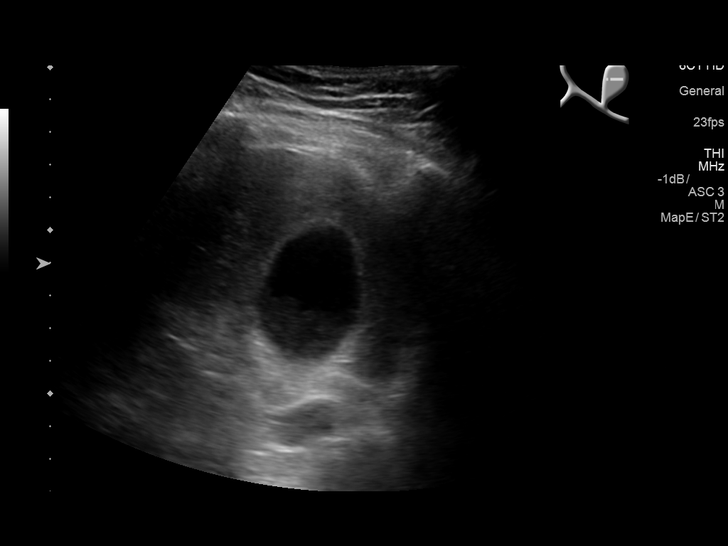
[im 51/95]
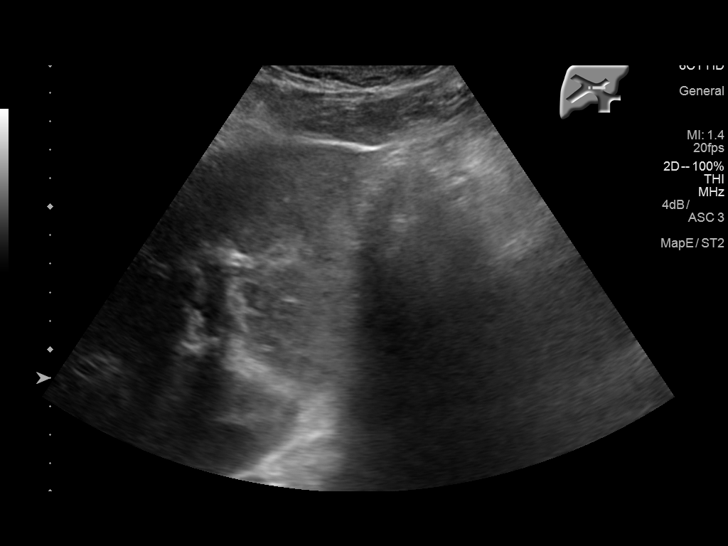
[im 59/95]
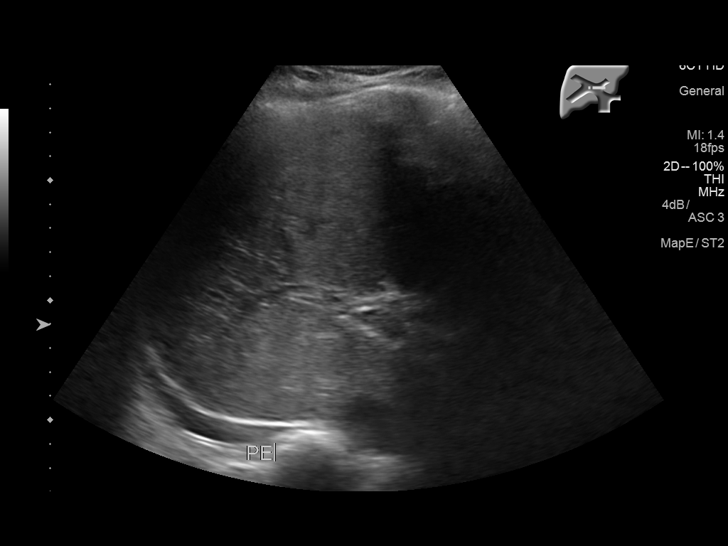
[im 63/95]
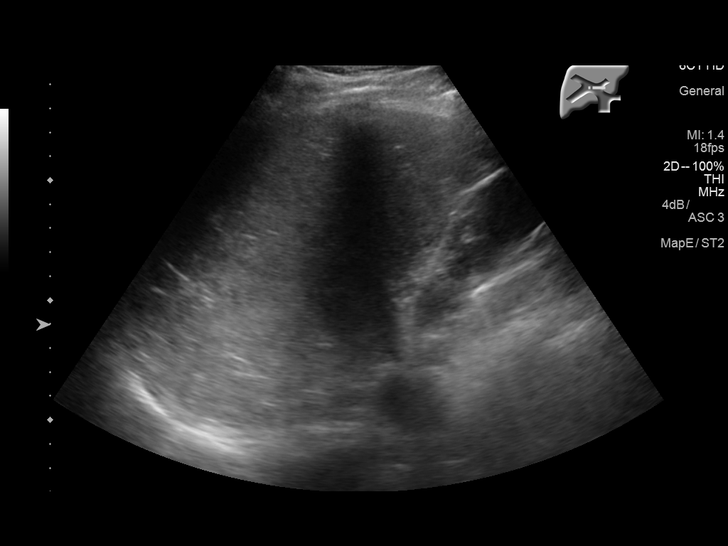
[im 71/95]
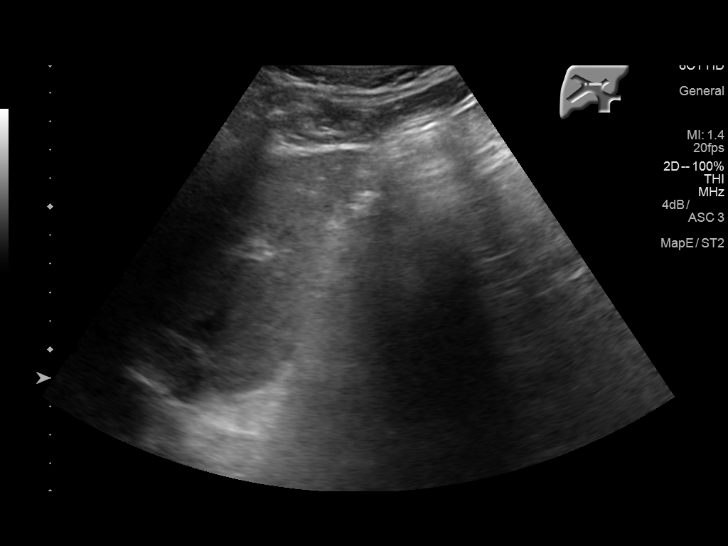
[im 79/95]
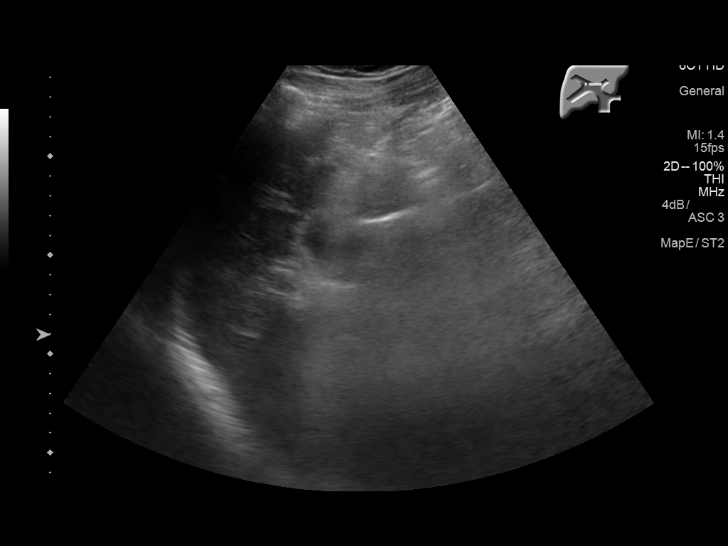
[im 87/95]
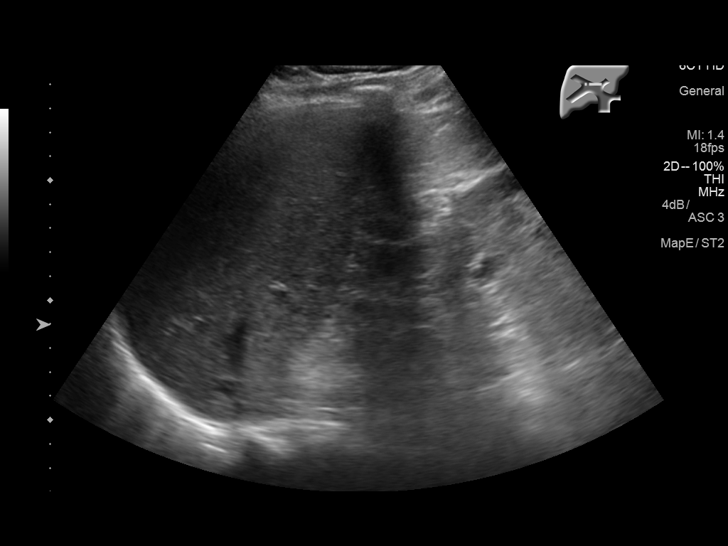
[im 95/95]
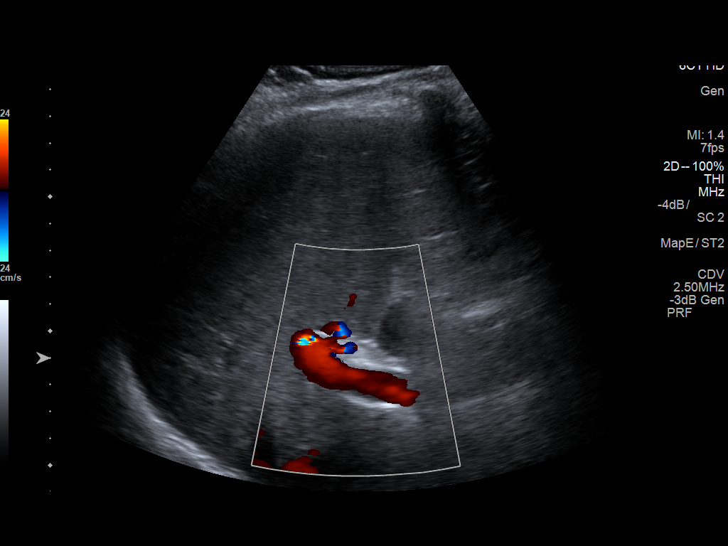

[14 of 25 positions shown; findings below may reference images not displayed]

FINDINGS: Gallbladder:

No gallstones or wall thickening visualized. No sonographic Murphy
sign noted by sonographer. Echogenic non shadowing material within
the dependent gallbladder is compatible with sludge or tiny stones.

Common bile duct:

Diameter: 2.4 mm

Liver:

No focal lesion identified. Within normal limits in parenchymal
echogenicity. Trace right pleural effusion.
IMPRESSION: Gallbladder sludge or tiny stones. No secondary signs of
cholecystitis. No common bile duct dilatation. Trace right pleural
effusion.

By: Zosia Grzegorz Greber M.D.

## 2017-10-09 ENCOUNTER — Encounter: Payer: Self-pay | Admitting: Internal Medicine

## 2017-10-09 ENCOUNTER — Ambulatory Visit: Payer: Medicare Other | Admitting: Nurse Practitioner

## 2017-10-09 ENCOUNTER — Telehealth: Payer: Self-pay | Admitting: Internal Medicine

## 2017-10-09 NOTE — Progress Notes (Deleted)
Referring Provider: Eusebio Me* Primary Care Physician:  Vanessa Ferry, FNP Primary GI:  Dr. Jena Gauss  No chief complaint on file.   HPI:   Johnathan Chavez is a 77 y.o. male who presents for follow-up on hemorrhoids and GERD.  Colonoscopy up-to-date 2016 which found hemorrhoids.  History of acute biliary pancreatitis without infection or necrosis on CT in 2018.  His last office visit he noted persistent hemorrhoid symptoms with protruding at every bowel movement and bleeding, post bowel movement persistent protrusion and anal leakage with hematochezia and significant odor.  Requested referral to general surgery for hemorrhoidectomy.  Constipation resolved on Colace, GERD well controlled.  Previous colonoscopy results from outside facility reviewed which found 2 polyps 1 of which was tubular adenoma, family history of colon cancer and recommended 5-year repeat exam in 2021.  After his last visit we sent in Anusol rectal cream for symptomatic treatment, referral to surgeon, follow-up 6 months.  The patient underwent extensive hemorrhoidectomy on 06/07/2017.  They removed 2 large columns of internal and external hemorrhoids at the 4:00 and 6:00 positions.  Another smaller internal hemorrhoid was also removed.  Postsurgical follow-up on 06/13/2017 (6-day postop) found some soilage of his undergarments, minimal blood per rectum.  I did increase fiber in his diet, mild soilage should resolve with time, follow-up in 3 weeks.  He did not appear to have a follow-up as recommended.  Today he states   Past Medical History:  Diagnosis Date  . A-fib (HCC)   . Anxiety   . Arthritis   . CAD (coronary artery disease)   . CKD (chronic kidney disease) stage 2, GFR 60-89 ml/min   . Depression   . Dysrhythmia    AFib  . GERD (gastroesophageal reflux disease)   . HTN (hypertension)   . Hyperlipidemia   . 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 APPENDECTOMY    . BACK SURGERY     X2  . CERVICAL DISC SURGERY    . COLONOSCOPY  04/2014   [facility]: moderate sized internal hemorrhoids, 2 tubular adenomas removed from colon. diverticulosis  . CORONARY ANGIOPLASTY WITH STENT PLACEMENT  08/18/2016   In a hospital in [place]  . CORONARY ARTERY BYPASS GRAFT  2001   1 graft  . ESOPHAGOGASTRODUODENOSCOPY  01/2015   Dr. Allena Katz: gastritis  . HEMORRHOID SURGERY N/A 06/07/2017   Procedure: EXTENSIVE HEMORRHOIDECTOMY;  Surgeon: Franky Macho, MD;  Location: [facility];  Service: General;  Laterality: N/A;  . INGUINAL HERNIA REPAIR Left   . SHOULDER ARTHROSCOPY Right   . SIGMOIDOSCOPY  01/2015   Dr. Allena Katz: internal hemorrhoids  . TONSILLECTOMY      Current Outpatient Medications  Medication Sig Dispense Refill  . amLODipine (NORVASC) 10 MG tablet Take 10 mg by mouth daily.  0  . Ascorbic Acid (VITAMIN C) 1000 MG tablet Take 1,000 mg by mouth daily.     . Calcium Carb-Cholecalciferol (CALCIUM PLUS VITAMIN D3 PO) Take 1 tablet by mouth daily at 2 PM.    . clopidogrel (PLAVIX) 75 MG tablet Take 75 mg by mouth daily.    Marland Kitchen escitalopram (LEXAPRO) 10 MG tablet Take 10 mg by mouth 2 (two) times daily.    . ferrous gluconate (IRON 27) 240 (27 FE) MG tablet Take 240 mg by mouth 2 (two) times a week.    . gabapentin (NEURONTIN) 300  MG capsule Take 300 mg by mouth 2 (two) times daily.     . Ginkgo Biloba 60 MG TABS Take 60 mg by mouth daily at 2 PM.     . glipiZIDE (GLUCOTROL) 10 MG tablet Take 10 mg by mouth 3 (three) times daily. Morning, noon, & night    . GLUCOSAMINE-MSM-HYALURONIC ACD PO Take 1 tablet by mouth daily at 2 PM.    . hydrocortisone (ANUSOL-HC) 2.5 % rectal cream Place 1 application rectally 2 (two) times daily. For up to 10 days at a time. (Patient taking differently: Place 1 application rectally 2 (two) times daily as needed (for discomfort). For up to 10 days at a time.) 30 g 1  . linaclotide  (LINZESS) 72 MCG capsule Take 1 capsule (72 mcg total) by mouth daily before breakfast. You can cut back to every other day if need be. (Patient taking differently: Take 72 mcg by mouth daily as needed (for constipation.). You can cut back to every other day if need be.) 30 capsule 5  . lisinopril (PRINIVIL,ZESTRIL) 20 MG tablet Take 20 mg by mouth daily.     . Multiple Vitamin (MULTIVITAMIN WITH MINERALS) TABS tablet Take 1 tablet by mouth daily at 2 PM.     . Multiple Vitamins-Minerals (PRESERVISION AREDS 2 PO) Take 1 capsule by mouth 2 (two) times daily. Morning and afternoon    . Omega-3 Fatty Acids (FISH OIL) 1200 MG CAPS Take 1,200 mg by mouth daily at 2 PM.     . omeprazole (PRILOSEC) 20 MG capsule Take 20 mg by mouth 2 (two) times daily before a meal.     . primidone (MYSOLINE) 50 MG tablet Take 50 mg by mouth at bedtime.    . Probiotic Product (RA PROBIOTIC GUMMIES) CHEW Chew 1 capsule by mouth 2 (two) times daily.     . propranolol (INDERAL) 10 MG tablet Take 10 mg by mouth 2 (two) times daily.    . saxagliptin HCl (ONGLYZA) 5 MG TABS tablet Take 5 mg by mouth daily.    . simvastatin (ZOCOR) 40 MG tablet Take 40 mg by mouth at bedtime.     . vitamin B-12 (CYANOCOBALAMIN) 1000 MCG tablet Take 1,000 mcg by mouth daily.    . Zinc 50 MG TABS Take 50 mg by mouth 2 (two) times a week.      No current facility-administered medications for this visit.     Allergies as of 10/09/2017 - Review Complete 06/13/2017  Allergen Reaction Noted  . Codeine Nausea Only 02/19/2015    Family History  Problem Relation Age of Onset  . Diabetes Mother   . Colon cancer Father        ?colon cancer, age early 6770    Social History   Socioeconomic History  . Marital status: Married    Spouse name: Not on file  . Number of children: Not on file  . Years of education: Not on file  . Highest education level: Not on file  Occupational History  . Not on file  Social Needs  . Financial resource strain:  Not on file  . Food insecurity:    Worry: Not on file    Inability: Not on file  . Transportation needs:    Medical: Not on file    Non-medical: Not on file  Tobacco Use  . Smoking status: Never Smoker  . Smokeless tobacco: Never Used  Substance and Sexual Activity  . Alcohol use: No  . Drug use: No  .  Sexual activity: Yes    Birth control/protection: None  Lifestyle  . Physical activity:    Days per week: Not on file    Minutes per session: Not on file  . Stress: Not on file  Relationships  . Social connections:    Talks on phone: Not on file    Gets together: Not on file    Attends religious service: Not on file    Active member of club or organization: Not on file    Attends meetings of clubs or organizations: Not on file    Relationship status: Not on file  Other Topics Concern  . Not on file  Social History Narrative  . Not on file    Review of Systems: General: Negative for anorexia, weight loss, fever, chills, fatigue, weakness. Eyes: Negative for vision changes.  ENT: Negative for hoarseness, difficulty swallowing , nasal congestion. CV: Negative for chest pain, angina, palpitations, dyspnea on exertion, peripheral edema.  Respiratory: Negative for dyspnea at rest, dyspnea on exertion, cough, sputum, wheezing.  GI: See history of present illness. GU:  Negative for dysuria, hematuria, urinary incontinence, urinary frequency, nocturnal urination.  MS: Negative for joint pain, low back pain.  Derm: Negative for rash or itching.  Neuro: Negative for weakness, abnormal sensation, seizure, frequent headaches, memory loss, confusion.  Psych: Negative for anxiety, depression, suicidal ideation, hallucinations.  Endo: Negative for unusual weight change.  Heme: Negative for bruising or bleeding. Allergy: Negative for rash or hives.   Physical Exam: There were no vitals taken for this visit. General:   Alert and oriented. Pleasant and cooperative. Well-nourished and  well-developed.  Head:  Normocephalic and atraumatic. Eyes:  Without icterus, sclera clear and conjunctiva pink.  Ears:  Normal auditory acuity. Mouth:  No deformity or lesions, oral mucosa pink.  Throat/Neck:  Supple, without mass or thyromegaly. Cardiovascular:  S1, S2 present without murmurs appreciated. Normal pulses noted. Extremities without clubbing or edema. Respiratory:  Clear to auscultation bilaterally. No wheezes, rales, or rhonchi. No distress.  Gastrointestinal:  +BS, soft, non-tender and non-distended. No HSM noted. No guarding or rebound. No masses appreciated.  Rectal:  Deferred  Musculoskalatal:  Symmetrical without gross deformities. Normal posture. Skin:  Intact without significant lesions or rashes. Neurologic:  Alert and oriented x4;  grossly normal neurologically. Psych:  Alert and cooperative. Normal mood and affect. Heme/Lymph/Immune: No significant cervical adenopathy. No excessive bruising noted.    10/09/2017 7:49 AM   Disclaimer: This note was dictated with voice recognition software. Similar sounding words can inadvertently be transcribed and may not be corrected upon review.

## 2017-10-09 NOTE — Telephone Encounter (Signed)
PATIENT WAS A NO SHOW AND LETTER SENT  °

## 2017-10-09 NOTE — Telephone Encounter (Signed)
Noted  

## 2017-11-09 IMAGING — MR MR ABDOMEN WO/W CM MRCP
10 of 24 series · 18 of 48 positions shown · IV contrast (multihance)
Comparison: Ultrasound from 03/05/2016 and CT abdomen pelvis from
03/04/2016.

CLINICAL DATA: Acute pancreatitis.

EXAM:
MRI ABDOMEN WITHOUT AND WITH CONTRAST (INCLUDING MRCP)
TECHNIQUE: Multiplanar multisequence MR imaging of the abdomen was performed
both before and after the administration of intravenous contrast.
Heavily T2-weighted images of the biliary and pancreatic ducts were
obtained, and three-dimensional MRCP images were rendered by post
processing.
CONTRAST:  18mL MULTIHANCE GADOBENATE DIMEGLUMINE 529 MG/ML IV SOLN

[Series 4: T2 fat-sat · axial · 5.0mm · 0.78mm/px · z∈[+50,+210]mm · 2 of 33 slices shown]
[im 1/33]
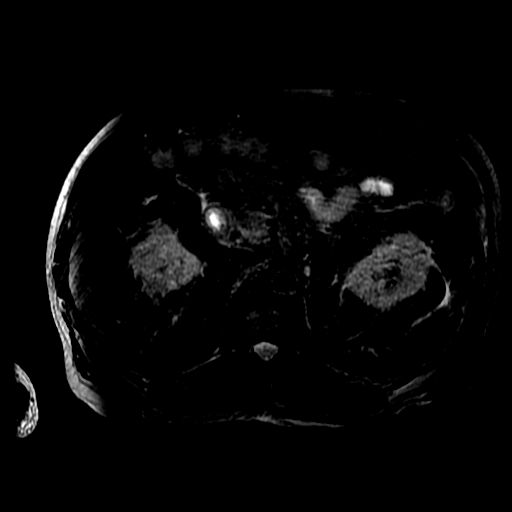
[im 33/33]
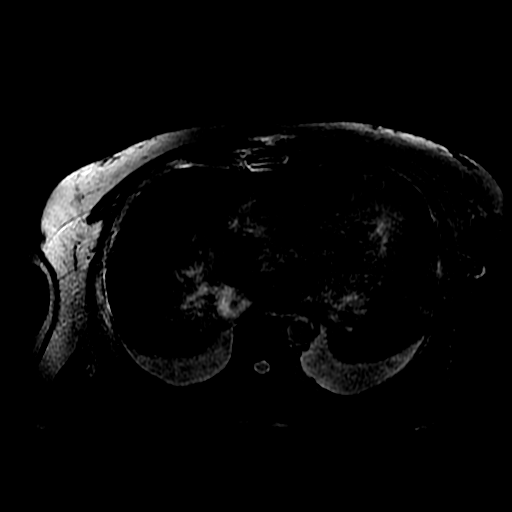

[Series 6: MRCP · coronal · 2.0mm · 0.70mm/px · 3 of 51 slices shown (1 of 2)]
[im 1/51]
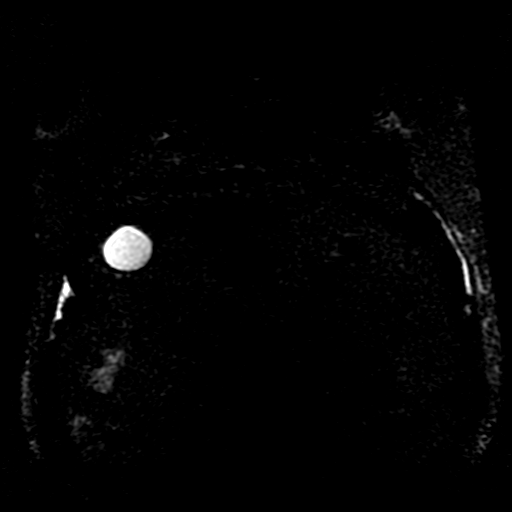
[im 26/51]
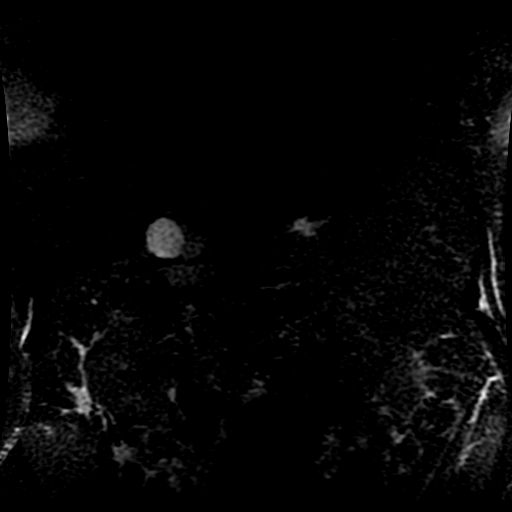
[im 51/51]
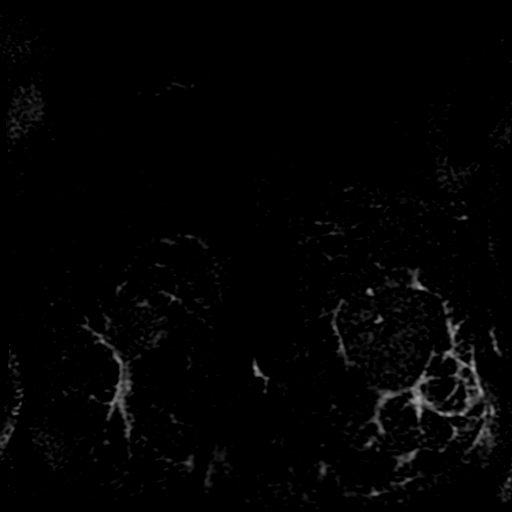

[Series 7: DWI b500 · axial · 6.0mm · 1.48mm/px · z∈[-10,+217]mm · 2 of 59 slices shown]
[im 1/59]
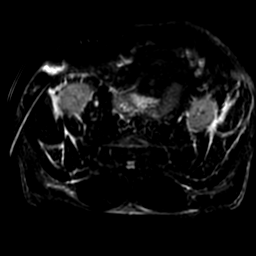
[im 59/59]
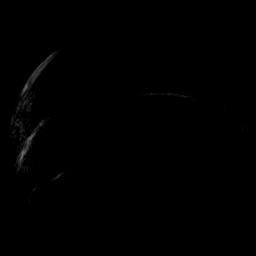

[Series 10: ax dualecho · axial · 5.0mm · 0.78mm/px · z∈[+49,+219]mm · 2 of 70 slices shown]
[im 1/70]
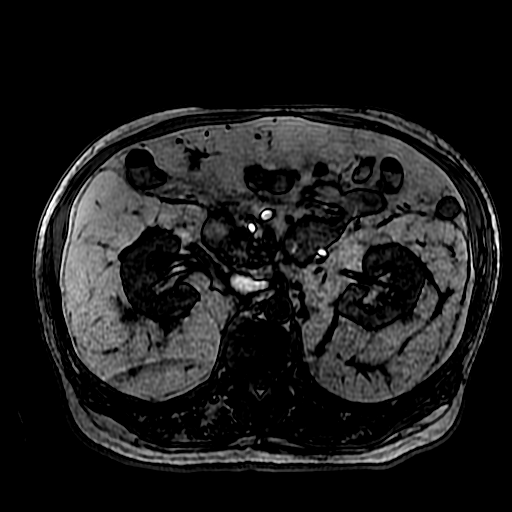
[im 70/70]
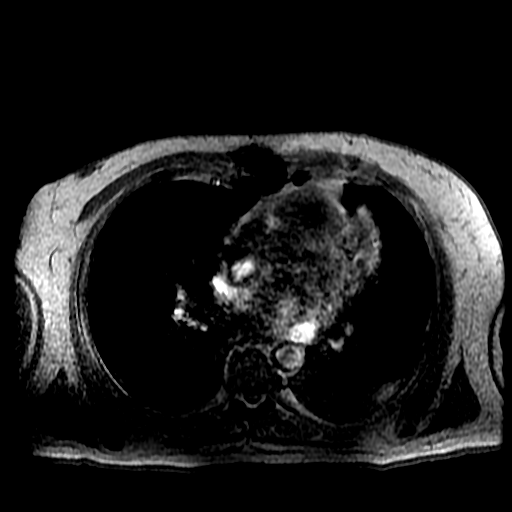

[Series 11: MRCP · sagittal · 40.0mm · 0.70mm/px · 1 of 6 slices shown (2 of 2)]
[im 1/6]
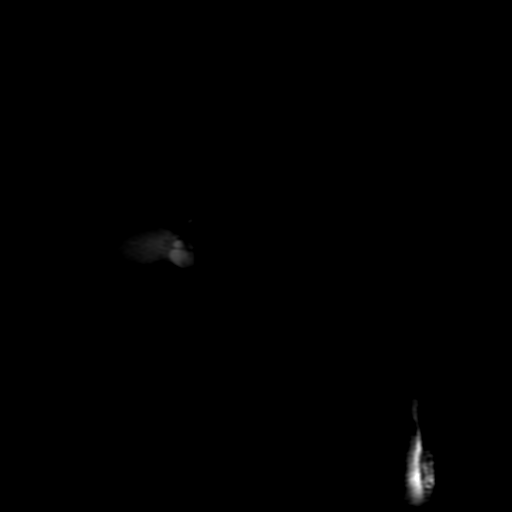

[Series 12: bSSFP fat-sat · coronal · 5.0mm · 0.70mm/px · 2 of 50 slices shown]
[im 1/50]
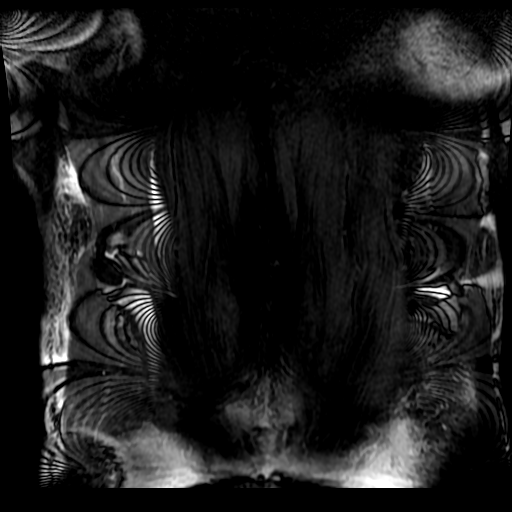
[im 50/50]
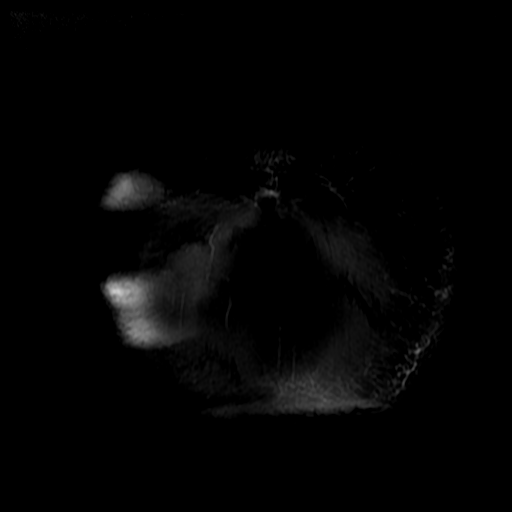

[Series 13: T2 · axial · 5.0mm · 0.78mm/px · 1 of 35 slices shown]
[im 1/35]
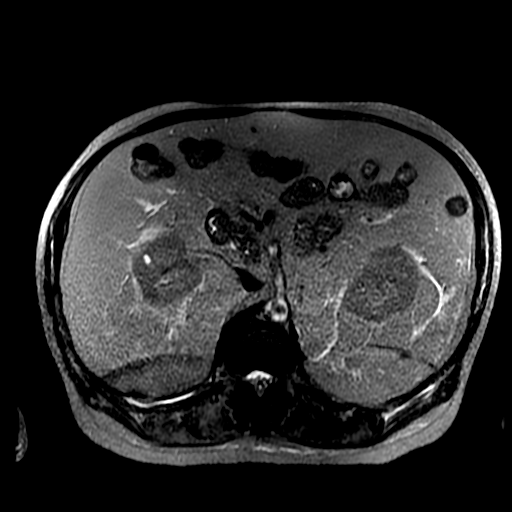

[Series 15: T1 dynamic · coronal · delayed · 5.0mm · 0.78mm/px · 3 of 88 slices shown]
[im 1/88]
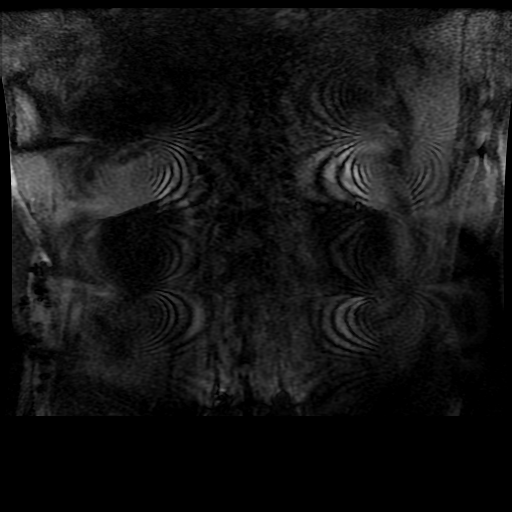
[im 44/88]
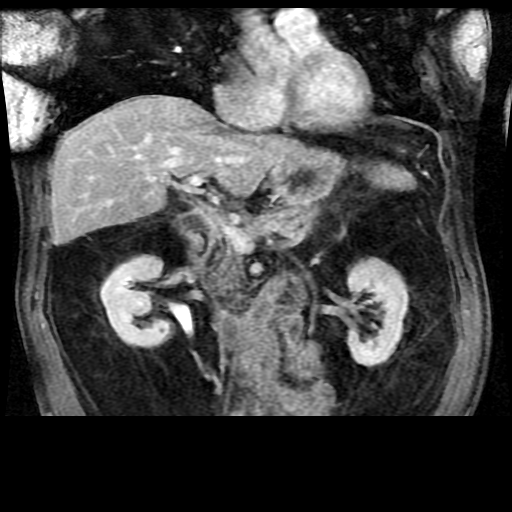
[im 88/88]
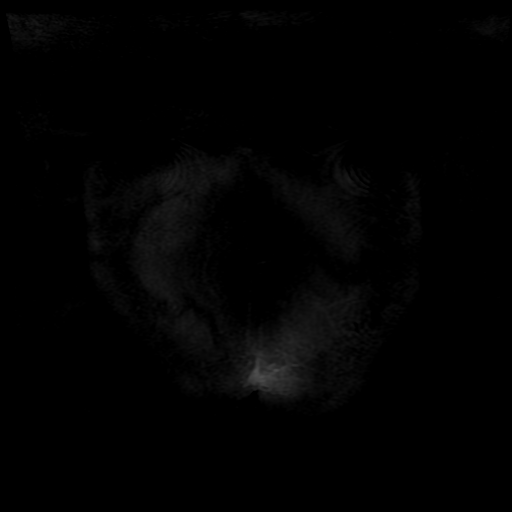

[Series 500: pjn · coronal · 1.6mm · 0.20mm/px · 1 of 20 slices shown]
[im 1/20]
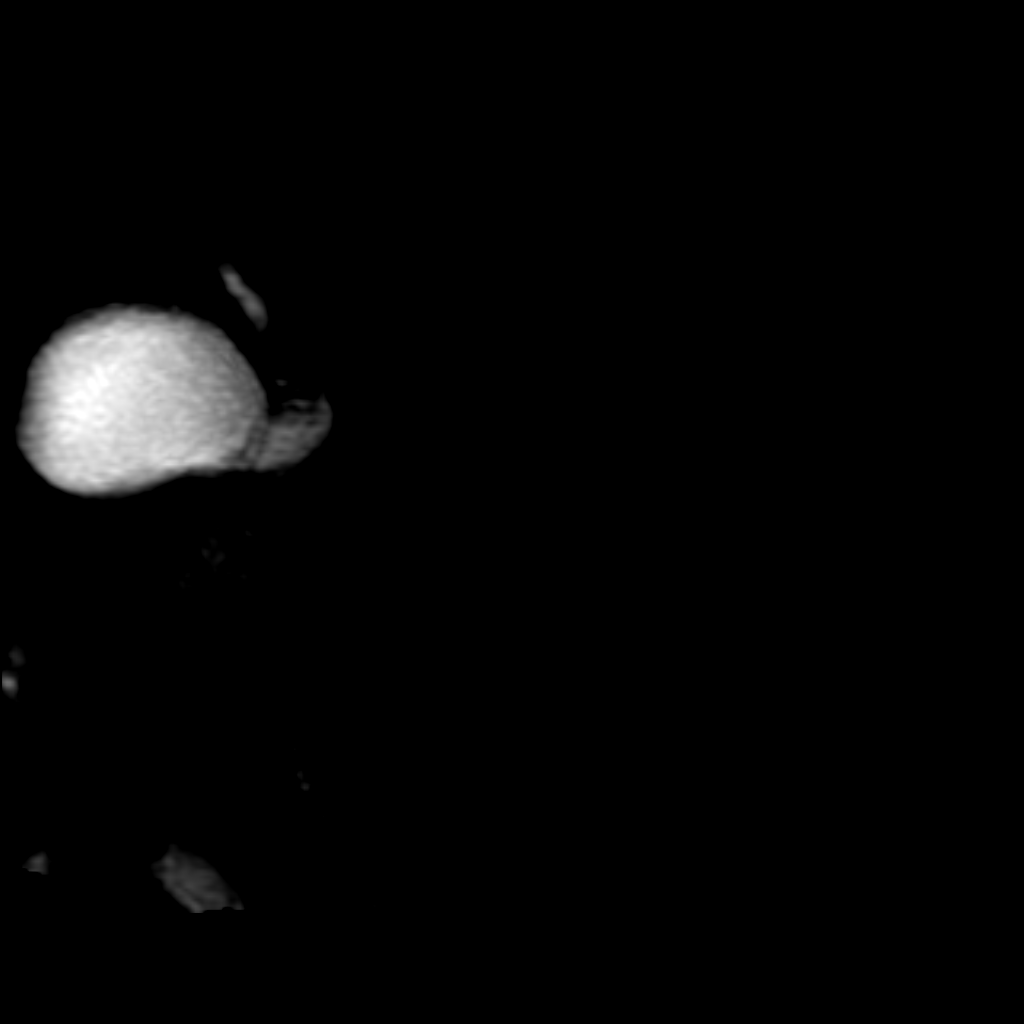

[Series 700: DWI · axial · 6.0mm · 1.48mm/px · 1 of 30 slices shown]
[im 1/30]
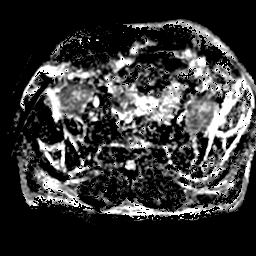

[18 of 48 positions shown; findings below may reference images not displayed]

FINDINGS: Lower chest: Small to moderate bilateral pleural effusions
identified. No pericardial effusion.

Hepatobiliary: No focal liver abnormality identified. The
gallbladder appears normal. No filling defects identified within the
gallbladder lumen. No common bile duct or intrahepatic bile duct
dilatation. There is no evidence for choledocholithiasis.

Pancreas: Mild edema with surrounding fat stranding. No pancreatic
duct dilatation and no fluid collections identified to suggest
pseudocyst formation. No findings identified to suggest pancreatic
necrosis secondary to pancreatitis.

Spleen:  Within normal limits in size and appearance.

Adrenals/Urinary Tract: No masses identified. No evidence of
hydronephrosis. Similar appearance of chronic bilateral perinephric
fat stranding.

Stomach/Bowel: Stomach appears normal. There is wall thickening
involving the second portion of the duodenal C-loop, image 31 of
series 12. No pathologic dilatation of the small or large bowel.

Vascular/Lymphatic: No pathologically enlarged lymph nodes
identified. No abdominal aortic aneurysm demonstrated.

Other:  None.

Musculoskeletal: No suspicious bone lesions identified.
IMPRESSION: 1. Mild pancreatic edema compatible with the clinical history of
pancreatitis. No complications of pancreatitis identified. No
significant pancreatic duct dilatation, pancreatic necrosis or
pseudocyst.
2. Wall thickening involving the descending portion of the duodenum
which may reflect secondary inflammatory changes versus primary
duodenitis.
3. Normal appearance of the gallbladder. No gallstones or evidence
of choledocholithiasis. No biliary dilatation.
4.

## 2020-01-24 DEATH — deceased
